# Patient Record
Sex: Female | Born: 1985 | Race: White | Hispanic: Yes | Marital: Single | Smoking: Never smoker
Health system: Southern US, Community
[De-identification: ages and names within clinical notes are randomized; demographics above are authoritative.]

## PROBLEM LIST (undated history)

## (undated) ENCOUNTER — Inpatient Hospital Stay (HOSPITAL_COMMUNITY): Payer: Self-pay

## (undated) DIAGNOSIS — Z789 Other specified health status: Secondary | ICD-10-CM

## (undated) HISTORY — PX: NO PAST SURGERIES: SHX2092

---
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
## 2004-03-26 ENCOUNTER — Inpatient Hospital Stay (HOSPITAL_COMMUNITY): Admission: AD | Admit: 2004-03-26 | Discharge: 2004-03-26 | Payer: Self-pay | Admitting: Family Medicine

## 2004-04-19 ENCOUNTER — Ambulatory Visit: Payer: Self-pay | Admitting: Family Medicine

## 2004-04-19 ENCOUNTER — Inpatient Hospital Stay (HOSPITAL_COMMUNITY): Admission: AD | Admit: 2004-04-19 | Discharge: 2004-04-21 | Payer: Self-pay | Admitting: *Deleted

## 2004-08-22 ENCOUNTER — Inpatient Hospital Stay (HOSPITAL_COMMUNITY): Admission: AD | Admit: 2004-08-22 | Discharge: 2004-08-22 | Payer: Self-pay | Admitting: *Deleted

## 2004-10-01 ENCOUNTER — Emergency Department (HOSPITAL_COMMUNITY): Admission: EM | Admit: 2004-10-01 | Discharge: 2004-10-01 | Payer: Self-pay | Admitting: Emergency Medicine

## 2004-11-27 ENCOUNTER — Inpatient Hospital Stay (HOSPITAL_COMMUNITY): Admission: AD | Admit: 2004-11-27 | Discharge: 2004-11-27 | Payer: Self-pay | Admitting: Obstetrics & Gynecology

## 2004-12-17 ENCOUNTER — Inpatient Hospital Stay (HOSPITAL_COMMUNITY): Admission: AD | Admit: 2004-12-17 | Discharge: 2004-12-17 | Payer: Self-pay | Admitting: Obstetrics & Gynecology

## 2005-07-24 ENCOUNTER — Inpatient Hospital Stay (HOSPITAL_COMMUNITY): Admission: AD | Admit: 2005-07-24 | Discharge: 2005-07-24 | Payer: Self-pay | Admitting: Obstetrics & Gynecology

## 2006-03-04 ENCOUNTER — Inpatient Hospital Stay (HOSPITAL_COMMUNITY): Admission: AD | Admit: 2006-03-04 | Discharge: 2006-03-06 | Payer: Self-pay | Admitting: Obstetrics

## 2008-01-06 ENCOUNTER — Inpatient Hospital Stay (HOSPITAL_COMMUNITY): Admission: AD | Admit: 2008-01-06 | Discharge: 2008-01-06 | Payer: Self-pay | Admitting: Family Medicine

## 2008-05-26 ENCOUNTER — Ambulatory Visit: Payer: Self-pay | Admitting: Family Medicine

## 2008-05-26 ENCOUNTER — Inpatient Hospital Stay (HOSPITAL_COMMUNITY): Admission: AD | Admit: 2008-05-26 | Discharge: 2008-05-26 | Payer: Self-pay | Admitting: Obstetrics & Gynecology

## 2008-05-28 ENCOUNTER — Ambulatory Visit (HOSPITAL_COMMUNITY): Admission: RE | Admit: 2008-05-28 | Discharge: 2008-05-28 | Payer: Self-pay | Admitting: Obstetrics & Gynecology

## 2008-08-29 ENCOUNTER — Inpatient Hospital Stay (HOSPITAL_COMMUNITY): Admission: AD | Admit: 2008-08-29 | Discharge: 2008-08-31 | Payer: Self-pay | Admitting: Obstetrics & Gynecology

## 2008-08-29 ENCOUNTER — Ambulatory Visit: Payer: Self-pay | Admitting: Advanced Practice Midwife

## 2010-06-13 LAB — RAPID URINE DRUG SCREEN, HOSP PERFORMED
Barbiturates: NOT DETECTED
Benzodiazepines: NOT DETECTED

## 2010-06-13 LAB — STREP B DNA PROBE

## 2010-06-13 LAB — DIFFERENTIAL
Basophils Relative: 0 % (ref 0–1)
Eosinophils Absolute: 0 10*3/uL (ref 0.0–0.7)
Lymphocytes Relative: 23 % (ref 12–46)
Lymphs Abs: 1.4 10*3/uL (ref 0.7–4.0)
Monocytes Absolute: 0.4 10*3/uL (ref 0.1–1.0)
Monocytes Relative: 6 % (ref 3–12)
Neutro Abs: 4.4 10*3/uL (ref 1.7–7.7)
Neutrophils Relative %: 71 % (ref 43–77)

## 2010-06-13 LAB — CBC
MCV: 95.3 fL (ref 78.0–100.0)
Platelets: 117 10*3/uL — ABNORMAL LOW (ref 150–400)
RBC: 3.97 MIL/uL (ref 3.87–5.11)
RDW: 13.1 % (ref 11.5–15.5)
WBC: 6.2 10*3/uL (ref 4.0–10.5)

## 2010-06-13 LAB — URINALYSIS, ROUTINE W REFLEX MICROSCOPIC
Bilirubin Urine: NEGATIVE
Nitrite: NEGATIVE
Protein, ur: NEGATIVE mg/dL

## 2010-06-13 LAB — HIV ANTIBODY (ROUTINE TESTING W REFLEX): HIV: NONREACTIVE

## 2010-06-13 LAB — GC/CHLAMYDIA PROBE AMP, GENITAL
Chlamydia, DNA Probe: NEGATIVE
GC Probe Amp, Genital: NEGATIVE

## 2010-06-13 LAB — TYPE AND SCREEN: ABO/RH(D): B POS

## 2010-06-13 LAB — RUBELLA SCREEN: Rubella: 10.7 IU/mL — ABNORMAL HIGH

## 2010-06-13 LAB — RPR: RPR Ser Ql: NONREACTIVE

## 2010-07-08 ENCOUNTER — Emergency Department (HOSPITAL_COMMUNITY): Payer: Self-pay

## 2010-07-08 ENCOUNTER — Emergency Department (HOSPITAL_COMMUNITY)
Admission: EM | Admit: 2010-07-08 | Discharge: 2010-07-08 | Disposition: A | Discharge status: Home / Self Care | Payer: Self-pay | Attending: Emergency Medicine | Admitting: Emergency Medicine

## 2010-07-08 DIAGNOSIS — R1110 Vomiting, unspecified: Secondary | ICD-10-CM | POA: Insufficient documentation

## 2010-07-08 DIAGNOSIS — R05 Cough: Secondary | ICD-10-CM | POA: Insufficient documentation

## 2010-07-08 DIAGNOSIS — R509 Fever, unspecified: Secondary | ICD-10-CM | POA: Insufficient documentation

## 2010-07-08 DIAGNOSIS — R059 Cough, unspecified: Secondary | ICD-10-CM | POA: Insufficient documentation

## 2010-07-08 DIAGNOSIS — J189 Pneumonia, unspecified organism: Secondary | ICD-10-CM | POA: Insufficient documentation

## 2010-07-08 DIAGNOSIS — IMO0001 Reserved for inherently not codable concepts without codable children: Secondary | ICD-10-CM | POA: Insufficient documentation

## 2010-07-08 LAB — URINALYSIS, ROUTINE W REFLEX MICROSCOPIC
Bilirubin Urine: NEGATIVE
Ketones, ur: NEGATIVE mg/dL
Nitrite: NEGATIVE
Protein, ur: NEGATIVE mg/dL
Specific Gravity, Urine: 1.019 (ref 1.005–1.030)
Urobilinogen, UA: 1 mg/dL (ref 0.0–1.0)

## 2010-07-08 LAB — DIFFERENTIAL
Basophils Absolute: 0 10*3/uL (ref 0.0–0.1)
Lymphocytes Relative: 25 % (ref 12–46)
Lymphs Abs: 1.2 10*3/uL (ref 0.7–4.0)

## 2010-07-08 LAB — COMPREHENSIVE METABOLIC PANEL
ALT: 24 U/L (ref 0–35)
AST: 16 U/L (ref 0–37)
Albumin: 3.2 g/dL — ABNORMAL LOW (ref 3.5–5.2)
Alkaline Phosphatase: 58 U/L (ref 39–117)
CO2: 27 mEq/L (ref 19–32)
Creatinine, Ser: <0.47 mg/dL (ref 0.4–1.2)
Glucose, Bld: 104 mg/dL — ABNORMAL HIGH (ref 70–99)
Potassium: 3.4 mEq/L — ABNORMAL LOW (ref 3.5–5.1)
Total Protein: 6.5 g/dL (ref 6.0–8.3)

## 2010-07-08 LAB — CBC
Hemoglobin: 13.5 g/dL (ref 12.0–15.0)
MCH: 30.3 pg (ref 26.0–34.0)
RBC: 4.46 MIL/uL (ref 3.87–5.11)
RDW: 11.7 % (ref 11.5–15.5)
WBC: 4.7 10*3/uL (ref 4.0–10.5)

## 2010-07-08 LAB — POCT PREGNANCY, URINE: Preg Test, Ur: NEGATIVE

## 2010-12-06 LAB — GC/CHLAMYDIA PROBE AMP, GENITAL
Chlamydia, DNA Probe: NEGATIVE
GC Probe Amp, Genital: NEGATIVE

## 2010-12-06 LAB — CBC
Hemoglobin: 13.6
RBC: 4.34
RDW: 12.3

## 2010-12-06 LAB — POCT PREGNANCY, URINE: Preg Test, Ur: POSITIVE

## 2010-12-06 LAB — WET PREP, GENITAL
Clue Cells Wet Prep HPF POC: NONE SEEN
Trich, Wet Prep: NONE SEEN

## 2010-12-06 LAB — HCG, QUANTITATIVE, PREGNANCY: hCG, Beta Chain, Quant, S: 10284 — ABNORMAL HIGH

## 2011-03-07 NOTE — L&D Delivery Note (Signed)
Delivery Note At 7:21 AM a viable malewas delivered via Vaginal, Spontaneous Delivery (Presentation: Right Occiput Anterior).  APGAR: 9, 9; weight .   Placenta status: Intact, Spontaneous.  Cord: 3 vessels with the following complications: nuchal cord X1, delivered through  Anesthesia: None  Episiotomy: None Lacerations: None Suture Repair: n/a Est. Blood Loss (mL): 400 Mom to postpartum.  Baby to nursery-stable.  CRESENZO-DISHMAN,Lori Rojas 10/07/2011, 7:34 AM

## 2011-05-16 ENCOUNTER — Inpatient Hospital Stay (HOSPITAL_COMMUNITY)
Admission: AD | Admit: 2011-05-16 | Discharge: 2011-05-16 | Disposition: A | Discharge status: Home / Self Care | Payer: Self-pay | Source: Ambulatory Visit | Attending: Family Medicine | Admitting: Family Medicine

## 2011-05-16 ENCOUNTER — Encounter (HOSPITAL_COMMUNITY): Payer: Self-pay | Admitting: *Deleted

## 2011-05-16 DIAGNOSIS — O99891 Other specified diseases and conditions complicating pregnancy: Secondary | ICD-10-CM | POA: Insufficient documentation

## 2011-05-16 DIAGNOSIS — R1011 Right upper quadrant pain: Secondary | ICD-10-CM | POA: Insufficient documentation

## 2011-05-16 DIAGNOSIS — Z3490 Encounter for supervision of normal pregnancy, unspecified, unspecified trimester: Secondary | ICD-10-CM

## 2011-05-16 DIAGNOSIS — R12 Heartburn: Secondary | ICD-10-CM | POA: Insufficient documentation

## 2011-05-16 HISTORY — DX: Other specified health status: Z78.9

## 2011-05-16 LAB — COMPREHENSIVE METABOLIC PANEL
AST: 17 U/L (ref 0–37)
BUN: 9 mg/dL (ref 6–23)
CO2: 26 mEq/L (ref 19–32)
Calcium: 8.9 mg/dL (ref 8.4–10.5)
Chloride: 102 mEq/L (ref 96–112)
Creatinine, Ser: 0.47 mg/dL — ABNORMAL LOW (ref 0.50–1.10)
GFR calc Af Amer: >90 mL/min (ref >90)
GFR calc non Af Amer: >90 mL/min (ref >90)
Glucose, Bld: 91 mg/dL (ref 70–99)
Total Bilirubin: 0.1 mg/dL — ABNORMAL LOW (ref 0.3–1.2)

## 2011-05-16 LAB — CBC
HCT: 32.8 % — ABNORMAL LOW (ref 36.0–46.0)
MCH: 30.9 pg (ref 26.0–34.0)
MCV: 90.6 fL (ref 78.0–100.0)
Platelets: 216 10*3/uL (ref 150–400)
RBC: 3.62 MIL/uL — ABNORMAL LOW (ref 3.87–5.11)

## 2011-05-16 LAB — URINALYSIS, ROUTINE W REFLEX MICROSCOPIC
Bilirubin Urine: NEGATIVE
Glucose, UA: NEGATIVE mg/dL
Hgb urine dipstick: NEGATIVE
Specific Gravity, Urine: <1.005 — ABNORMAL LOW (ref 1.005–1.030)
Urobilinogen, UA: 0.2 mg/dL (ref 0.0–1.0)

## 2011-05-16 MED ORDER — RANITIDINE HCL 150 MG PO TABS: 150.0000 mg | ORAL_TABLET | Freq: Two times a day (BID) | ORAL | Status: DC

## 2011-05-16 MED ORDER — GI COCKTAIL ~~LOC~~
30.0000 mL | Freq: Once | ORAL | Status: AC
  Administered 2011-05-16: 30 mL via ORAL
  Filled 2011-05-16: qty 30

## 2011-05-16 NOTE — MAU Note (Signed)
Pt reports pain in R upper side of abdomen that radiates into her back. The pain started last night and is still there currently. Pt rates pain 6/10. Pt denies bleeding or leaking of fluid.

## 2011-05-16 NOTE — MAU Provider Note (Signed)
History     CSN: [id number]  Arrival date and time: 05/16/11 2008   First Provider Initiated Contact with Patient 05/16/11 2107      Chief Complaint  Patient presents with  . Abdominal Pain   HPI 26 y.o. [id number] at Unknown EGA. RUQ pain since last night, constant, no n/v, no fever or chills. No prenatal care yet. + fetal movement x 2 months. No vaginal bleeding, no cramping/contractions. 3 prior NSVDs, uncomplicated prenatal history.     Past Medical History  Diagnosis Date  . No pertinent past medical history     Past Surgical History  Procedure Date  . No past surgeries     Family History  Problem Relation Age of Onset  . Anesthesia problems Neg Hx   . Hypotension Neg Hx   . Malignant hyperthermia Neg Hx   . Pseudochol deficiency Neg Hx     History  Substance Use Topics  . Smoking status: Not on file  . Smokeless tobacco: Never Used  . Alcohol Use: No    Allergies: No Known Allergies  Prescriptions prior to admission  Medication Sig Dispense Refill  . acetaminophen (TYLENOL) 325 MG suppository Place 325 mg rectally every 4 (four) hours as needed. Head ache        Review of Systems  Constitutional: Negative.   Respiratory: Negative.   Cardiovascular: Negative.   Gastrointestinal: Positive for abdominal pain. Negative for nausea, vomiting, diarrhea (RUQ) and constipation.  Genitourinary: Negative for dysuria, urgency, frequency, hematuria and flank pain.       Negative for vaginal bleeding, cramping/contractions  Musculoskeletal: Negative.   Neurological: Negative.   Psychiatric/Behavioral: Negative.    Physical Exam   There were no vitals taken for this visit.  Physical Exam  Nursing note and vitals reviewed. Constitutional: She is oriented to person, place, and time. She appears well-developed and well-nourished. No distress.  Cardiovascular: Normal rate.   Respiratory: Effort normal. No respiratory distress.  GI: Soft. She exhibits no  distension and no mass. There is tenderness (RUQ/epigastric). There is no rebound and no guarding.       Fundal height at umbilicus  Musculoskeletal: Normal range of motion.  Neurological: She is alert and oriented to person, place, and time.  Skin: Skin is warm and dry.  Psychiatric: She has a normal mood and affect.    MAU Course  Procedures  Results for orders placed during the hospital encounter of 05/16/11 (from the past 24 hour(s))  URINALYSIS, ROUTINE W REFLEX MICROSCOPIC     Status: Abnormal   Collection Time   05/16/11  8:20 PM      Component Value Range   Color, Urine YELLOW  YELLOW    APPearance CLEAR  CLEAR    Specific Gravity, Urine <1.005 (*) 1.005 - 1.030    pH 6.0  5.0 - 8.0    Glucose, UA NEGATIVE  NEGATIVE (mg/dL)   Hgb urine dipstick NEGATIVE  NEGATIVE    Bilirubin Urine NEGATIVE  NEGATIVE    Ketones, ur NEGATIVE  NEGATIVE (mg/dL)   Protein, ur NEGATIVE  NEGATIVE (mg/dL)   Urobilinogen, UA 0.2  0.0 - 1.0 (mg/dL)   Nitrite NEGATIVE  NEGATIVE    Leukocytes, UA NEGATIVE  NEGATIVE   CBC     Status: Abnormal   Collection Time   05/16/11  9:20 PM      Component Value Range   WBC 8.5  4.0 - 10.5 (K/uL)   RBC 3.62 (*) 3.87 - 5.11 (MIL/uL)  Hemoglobin 11.2 (*) 12.0 - 15.0 (g/dL)   HCT 16.1 (*) 09.6 - 46.0 (%)   MCV 90.6  78.0 - 100.0 (fL)   MCH 30.9  26.0 - 34.0 (pg)   MCHC 34.1  30.0 - 36.0 (g/dL)   RDW 04.5  40.9 - 81.1 (%)   Platelets 216  150 - 400 (K/uL)  COMPREHENSIVE METABOLIC PANEL     Status: Abnormal   Collection Time   05/16/11  9:20 PM      Component Value Range   Sodium 136  135 - 145 (mEq/L)   Potassium 3.2 (*) 3.5 - 5.1 (mEq/L)   Chloride 102  96 - 112 (mEq/L)   CO2 26  19 - 32 (mEq/L)   Glucose, Bld 91  70 - 99 (mg/dL)   BUN 9  6 - 23 (mg/dL)   Creatinine, Ser 9.14 (*) 0.50 - 1.10 (mg/dL)   Calcium 8.9  8.4 - 78.2 (mg/dL)   Total Protein 5.8 (*) 6.0 - 8.3 (g/dL)   Albumin 2.7 (*) 3.5 - 5.2 (g/dL)   AST 17  0 - 37 (U/L)   ALT 17  0 - 35  (U/L)   Alkaline Phosphatase 56  39 - 117 (U/L)   Total Bilirubin 0.1 (*) 0.3 - 1.2 (mg/dL)   GFR calc non Af Amer >90  >90 (mL/min)   GFR calc Af Amer >90  >90 (mL/min)   Pain completely resolved with GI cocktail  Assessment and Plan  26 y.o. G4P3003 at approx [redacted] weeks EGA GERD in pregnancy - resolved with GI cocktail, rx ranitidine Outpatient dating/anatomy u/s ordered Follow up for prenatal care as soon as possible - planning on care at [facility] 05/16/2011, 10:12 PM

## 2011-05-17 NOTE — MAU Provider Note (Signed)
Chart reviewed and agree with management and plan.  

## 2011-05-23 ENCOUNTER — Ambulatory Visit (HOSPITAL_COMMUNITY)
Admission: RE | Admit: 2011-05-23 | Discharge: 2011-05-23 | Disposition: A | Discharge status: Home / Self Care | Payer: Self-pay | Source: Ambulatory Visit | Attending: Advanced Practice Midwife | Admitting: Advanced Practice Midwife

## 2011-05-23 DIAGNOSIS — Z3689 Encounter for other specified antenatal screening: Secondary | ICD-10-CM | POA: Insufficient documentation

## 2011-05-23 DIAGNOSIS — Z3490 Encounter for supervision of normal pregnancy, unspecified, unspecified trimester: Secondary | ICD-10-CM

## 2011-08-27 ENCOUNTER — Inpatient Hospital Stay (HOSPITAL_COMMUNITY)
Admission: AD | Admit: 2011-08-27 | Discharge: 2011-08-27 | Disposition: A | Discharge status: Home / Self Care | Payer: Self-pay | Source: Ambulatory Visit | Attending: Family Medicine | Admitting: Family Medicine

## 2011-08-27 ENCOUNTER — Encounter (HOSPITAL_COMMUNITY): Payer: Self-pay | Admitting: *Deleted

## 2011-08-27 DIAGNOSIS — O4700 False labor before 37 completed weeks of gestation, unspecified trimester: Secondary | ICD-10-CM | POA: Diagnosis present

## 2011-08-27 DIAGNOSIS — O0930 Supervision of pregnancy with insufficient antenatal care, unspecified trimester: Secondary | ICD-10-CM

## 2011-08-27 LAB — WET PREP, GENITAL: Yeast Wet Prep HPF POC: NONE SEEN

## 2011-08-27 LAB — FETAL FIBRONECTIN: Fetal Fibronectin: NEGATIVE

## 2011-08-27 LAB — URINALYSIS, ROUTINE W REFLEX MICROSCOPIC
Leukocytes, UA: NEGATIVE
Nitrite: NEGATIVE
Protein, ur: NEGATIVE mg/dL
Specific Gravity, Urine: 1.02 (ref 1.005–1.030)
Urobilinogen, UA: 0.2 mg/dL (ref 0.0–1.0)

## 2011-08-27 LAB — DIFFERENTIAL
Basophils Absolute: 0 10*3/uL (ref 0.0–0.1)
Eosinophils Relative: 1 % (ref 0–5)
Lymphocytes Relative: 25 % (ref 12–46)
Lymphs Abs: 2 10*3/uL (ref 0.7–4.0)
Monocytes Absolute: 0.6 10*3/uL (ref 0.1–1.0)
Neutro Abs: 5.3 10*3/uL (ref 1.7–7.7)

## 2011-08-27 LAB — CBC
HCT: 37.1 % (ref 36.0–46.0)
MCV: 90.5 fL (ref 78.0–100.0)
RBC: 4.1 MIL/uL (ref 3.87–5.11)
RDW: 12.7 % (ref 11.5–15.5)
WBC: 7.9 10*3/uL (ref 4.0–10.5)

## 2011-08-27 LAB — RPR: RPR Ser Ql: NONREACTIVE

## 2011-08-27 LAB — HIV ANTIBODY (ROUTINE TESTING W REFLEX): HIV: NONREACTIVE

## 2011-08-27 NOTE — Progress Notes (Signed)
CNM collected GC/Chlam and GBS cultures.

## 2011-08-27 NOTE — MAU Note (Signed)
Contracting since 2000. No bleeding or leaking

## 2011-08-27 NOTE — Progress Notes (Signed)
EFm d/ced. D/C plan discussed with pt. Prenatal labs drawn. In house interpreter helping with specific questions patient had.

## 2011-08-27 NOTE — MAU Provider Note (Signed)
Chart reviewed and agree with management and plan.  

## 2011-08-27 NOTE — Progress Notes (Signed)
Dr Berline Chough notified of pt's admission and status. Aware u/a sent. Will see pt

## 2011-08-27 NOTE — Progress Notes (Signed)
Notified of negative FFN. CNM will let Dr Berline Chough know as well

## 2011-08-27 NOTE — Progress Notes (Signed)
Written and verbal d/c instructions given and understanding voiced. 

## 2011-08-27 NOTE — Progress Notes (Signed)
FFN and wet prep obtained.

## 2011-08-27 NOTE — MAU Provider Note (Signed)
Chief Complaint:  Contractions   First Provider Initiated Contact with Patient 08/27/11 0321      HPI  Lori Rojas is a 26 y.o. G4P3003 at [redacted]w[redacted]d presenting with contractions since yesterday afternoon.  She has not had prenatal care in this pregnancy because of transportation issues.  Her pregnancy is dated by [redacted]w[redacted]d sono done in MAU.  She reports good fetal movement, denies LOF, vaginal bleeding, vaginal itching/burning, urinary symptoms, h/a, dizziness, n/v, or fever/chills.     Pregnancy Course: uncomplicated  Past Medical History: Past Medical History  Diagnosis Date  . No pertinent past medical history     Past Surgical History: Past Surgical History  Procedure Date  . No past surgeries     Family History: Family History  Problem Relation Age of Onset  . Anesthesia problems Neg Hx   . Hypotension Neg Hx   . Malignant hyperthermia Neg Hx   . Pseudochol deficiency Neg Hx   . Other Neg Hx     Social History: History  Substance Use Topics  . Smoking status: Never Smoker   . Smokeless tobacco: Never Used  . Alcohol Use: No    Allergies: No Known Allergies  Meds:  Prescriptions prior to admission  Medication Sig Dispense Refill  . Prenatal Vit-Fe Fumarate-FA (PRENATAL MULTIVITAMIN) TABS Take 1 tablet by mouth daily.      Marland Kitchen DISCONTD: acetaminophen (TYLENOL) 325 MG suppository Place 325 mg rectally every 4 (four) hours as needed. Head ache      . DISCONTD: ranitidine (ZANTAC) 150 MG tablet Take 1 tablet (150 mg total) by mouth 2 (two) times daily.  60 tablet  1      Physical Exam  Blood pressure 99/63, pulse 87, temperature 97.9 F (36.6 C), temperature source Oral, resp. rate 20, height 5' (1.524 m), weight 73.029 kg (161 lb). GENERAL: Well-developed, well-nourished female in no acute distress.  HEENT: normocephalic, good dentition HEART: normal rate RESP: normal effort ABDOMEN: Soft, nontender, gravid appropriate for gestational age EXTREMITIES:  Nontender, no edema NEURO: alert and oriented  Pelvic exam: Cervix pink, visually closed, without lesion, scant white creamy discharge, vaginal walls and external genitalia normal  Dilation: 1 Effacement (%): 20 Cervical Position: Posterior Exam by:: Sharen Counter CNM  FHT:  145 with moderate variability, positive accels, negative decels--Category I Toco: Irritability with irregular contractions every 10-15 min   Labs: Results for orders placed during the hospital encounter of 08/27/11 (from the past 24 hour(s))  URINALYSIS, ROUTINE W REFLEX MICROSCOPIC     Status: Normal   Collection Time   08/27/11  2:30 AM      Component Value Range   Color, Urine YELLOW  YELLOW   APPearance CLEAR  CLEAR   Specific Gravity, Urine 1.020  1.005 - 1.030   pH 6.0  5.0 - 8.0   Glucose, UA NEGATIVE  NEGATIVE mg/dL   Hgb urine dipstick NEGATIVE  NEGATIVE   Bilirubin Urine NEGATIVE  NEGATIVE   Ketones, ur NEGATIVE  NEGATIVE mg/dL   Protein, ur NEGATIVE  NEGATIVE mg/dL   Urobilinogen, UA 0.2  0.0 - 1.0 mg/dL   Nitrite NEGATIVE  NEGATIVE   Leukocytes, UA NEGATIVE  NEGATIVE  FETAL FIBRONECTIN     Status: Normal   Collection Time   08/27/11  3:25 AM      Component Value Range   Fetal Fibronectin NEGATIVE  NEGATIVE  WET PREP, GENITAL     Status: Abnormal   Collection Time   08/27/11  3:25 AM  Component Value Range   Yeast Wet Prep HPF POC NONE SEEN  NONE SEEN   Trich, Wet Prep NONE SEEN  NONE SEEN   Clue Cells Wet Prep HPF POC NONE SEEN  NONE SEEN   WBC, Wet Prep HPF POC TOO NUMEROUS TO COUNT (*) NONE SEEN  CBC     Status: Normal   Collection Time   08/27/11  6:07 AM      Component Value Range   WBC 7.9  4.0 - 10.5 K/uL   RBC 4.10  3.87 - 5.11 MIL/uL   Hemoglobin 12.8  12.0 - 15.0 g/dL   HCT 16.1  09.6 - 04.5 %   MCV 90.5  78.0 - 100.0 fL   MCH 31.2  26.0 - 34.0 pg   MCHC 34.5  30.0 - 36.0 g/dL   RDW 40.9  81.1 - 91.4 %   Platelets 175  150 - 400 K/uL  DIFFERENTIAL     Status:  Normal   Collection Time   08/27/11  6:07 AM      Component Value Range   Neutrophils Relative 67  43 - 77 %   Neutro Abs 5.3  1.7 - 7.7 K/uL   Lymphocytes Relative 25  12 - 46 %   Lymphs Abs 2.0  0.7 - 4.0 K/uL   Monocytes Relative 7  3 - 12 %   Monocytes Absolute 0.6  0.1 - 1.0 K/uL   Eosinophils Relative 1  0 - 5 %   Eosinophils Absolute 0.1  0.0 - 0.7 K/uL   Basophils Relative 0  0 - 1 %   Basophils Absolute 0.0  0.0 - 0.1 K/uL     Assessment: Threatened preterm labor No prenatal care   Plan: GC/Chlamydia, GBS, prenatal panel pending D/C home with PTL precautions Message sent to [facility] to make appointment for pt Return to MAU as needed  LEFTWICH-KIRBY, Nichelle Renwick 6/23/20136:31 AM

## 2011-08-27 NOTE — Discharge Instructions (Signed)
El ABC del Psychiatrist (ABCs of Pregnancy) A A La atencin antes del parto es muy importante. Asegrese de Science writer a su mdico y recibir atencin prenatal tan rpido como usted crea que est embarazada. En este momento, se la controlar por posibles infecciones, anormalidades genticas y problemas potenciales. Este es el momento para discutir la dieta, el ejercicio, el [place], los medicamentos, el [place], los medicamentos para el dolor durante el parto y la posibilidad de un parto por Copy. Haga todas las preguntas que puedan preocuparla. Es importante que consulte a su mdico con regularidad durante todo Firefighter. Evite el contacto con sustancias txicas y productos qumicos, como disolventes de limpieza, plomo y mercurio, algunos insecticidas y pinturas. Las mujeres embarazadas deben evitar la exposicin a los vapores de [place], y los humos que la hagan sentir enferma, mareada o dbil. Cuando sea posible, tenga una consulta con su mdico antes del embarazo para recibir Optometrist pudiera sugerir, como tomar cido flico, Radio producer ejercicios, dejar de fumar, evitar las bebidas alcohlicas, etc. B La lactancia materna es la opcin ms saludable para usted y su beb. Tiene muchos beneficios nutricionales para al beb y [address] Tambin crea un vnculo muy estrecho entre el beb y [place]. Hable con su mdico, su familia, sus amigos y su empleador acerca de cmo usted decidir alimentar a su beb y como pueden ayudarle a decidir. No todos los defectos congnitos pueden ser evitados, pero una mujer puede tomar decisiones para aumentar las posibilidades de tener un beb sano. Muchos defectos congnitos ocurren al principio del Psychiatrist, a veces antes de que la mujer sepa que est [place]. Los defectos o anormalidades congnitas de cualquier nio de su familia o la del padre deben ser comentados con su mdico. Obtener un buen sostn para los cambios del tamao de las [place]. selo en especial  cuando hace ejercicios o cuando amamante.  C Festeje la noticia de su embarazo con su Cnyuge/padre y su familia. Las Clases de parto son tiles para usted y Product/process development scientist cnyuge/padre, porque le ayudan a entender lo que sucede durante el Psychiatrist y [place]. El parto por cesrea se debe discutir con el mdico para estar preparado para esa posibilidad. Las ventajas y las desventajas de la [place] si es un nio, se deben comentar con Presenter, broadcasting. El tabaquismo durante el embarazo puede dar como resultado bebs con bajo peso al nacer. Se ha asociado con la infertilidad, abortos espontneos, embarazos ectpicos, mortalidad infantil y Insurance underwriter salud (morbilidad) en la infancia. Adems, el tabaquismo puede causar problemas de aprendizaje a largo plazo. Si usted fuma, debe tratar de dejar de fumar antes de [place] y no durante el embarazo. El humo secundario tambin puede hacerles dao a la mam y a su beb en desarrollo. Es Neomia Dear buena idea pedirle a la gente que deje de fumar a su alrededor Academic librarian y despus del nacimiento del beb. El [place] extra es necesario durante el Psychiatrist y se Occupational psychologist en las vitaminas prenatales, en los productos lcteos, vegetales de hojas verdes y en los suplementos de calcio. D Una Dieta saludable de acuerdo a su peso y su talla actual, junto con las vitaminas y suplementos minerales debe ser discutida con su mdico. El abuso /o la violencia Domstica deben darse a conocer inmediatamente para corregir la situacin. Tome ms agua cuando haga ejercicios para mantenerse hidratada. Por lo general las molestias de la espalda y las piernas aparecen y avanzan a Glass blower/designer de mediados del [place]  trimestre hasta el nacimiento del beb. Eso se debe al aumento de tamao del beb y del tero, que tambin puede afectar su equilibro. No consuma Drogas. Las drogas ilegales pueden daar seriamente al beb y a usted. Beba ms lquidos (lo mejor es el agua) Durante todo el embarazo para  ayudar a su cuerpo a Radio producer aumento del volumen sanguneo. Beba por lo menos 6 a 8 vasos de [place], jugo de frutas o [employer]. Una buena manera de saber que est bebiendo suficiente lquido es cuando la [place] se ve casi como el agua clara o de color amarillo muy claro.  E Coma alimentos sanos para obtener los nutrientes que usted y su beb necesitan al Psychologist, clinical. Sus alimentos deben Johnson [employer] Johnson cinco grupos bsicos. El Ejercicio (30 minutos de ejercicio leve a moderado al da) es importante y se aconseja durante el [place], si no tiene problemas de [place]. Si el ejercicio le causa malestar o mareos debe interrumpirlo e informar a su mdico. Las emociones durante el embarazo pueden pasar de la euforia a la depresin y deben ser comprendidos por usted, su pareja y su familia. F La evaluacin fetal con ecografas, la amniocentesis y los controles durante el [place] y [place] parto son algo habitual y a Research officer, political party. Tome 400 mg. de cido [employer], si es posible antes y Energy Transfer Partners primeros meses del Psychiatrist para reducir el riesgo de defectos congnitos del cerebro y la columna vertebral. Todas las mujeres que podran quedar embarazadas deben tomar una vitamina con cido flico todos [place]. Tambin es importante seguir una dieta saludable con alimentos enriquecidos (cereales enriquecidos, arroz, panes y pastas) y alimentos que sean fuentes naturales de cido flico (jugo de naranja, vegetales de hojas verdes, frijoles, man, brcoli, esprragos, arvejas y Therapist, occupational). El padre debe involucrarse en todos los aspectos del embarazo incluyendo el cuidado prenatal, las clases de [place], [place] parto y el tiempo despus del [place]. Los padres tambin pueden tener problemas emocionales como ser [place], [place] econmicos y llevar adelante [place]. G Las pruebas Genticas se deben Landscape architect. Es Secretary/administrator la historia de su familia y la del padre. Si ha habido problemas con embarazos o  defectos de nacimiento en su familia, informe de inmediato a su mdico. Adems, los consejeros genticos pueden hablar con usted acerca de la informacin que pueda necesitar en la toma de decisiones acerca de tener una familia. Usted puede llamar a un centro mdico en su rea para ayudar en la bsqueda de un consejero gentico certificado por el consejo. La asesora y prueba gentica se deben hacer antes del embarazo siempre que sea posible, especialmente si hay antecedentes de problemas en la familia del padre o de la [place]. Ciertos grupos tnicos tienen un mayor riesgo de defectos genticos. H Familiarcese con [facility] en el que va a tener su beb. Conozca cunto tiempo se tarda en llegar, la sala de preparto y nacimiento y los procedimientos del hospital. Asegrese de que su seguro mdico es aceptado en ese Environmental consultant. Haga que su Hogar est listo para el beb, incluyendo la ropa, la habitacin del beb (cuando sea posible), muebles y asientos del automvil. El lavado de manos es importante durante todo el da, especialmente despus de tocar carne cruda y aves de corral, de cambiar el paal del beb y de ir al bao. Esto puede ayudarle a Geologist, engineering de bacterias y virus que causan infecciones. Su pelo puede volverse seco y ms fino, pero  volver a la normalidad unas semanas despus de que nazca el beb. La acidez es un problema comn que puede ser tratado tomando anticidos recetados por su mdico, comiendo alimentos en porciones ms pequeas 5  6 veces por da, no beber lquidos al comer, beber entre comidas y elevar la cabecera de su cama 2  3 pulgadas. I El seguro que le d cobertura a usted y al beb, a los gastos del mdico y del hospital debe ser revisado, para saber con anticipacin qu gastos no cubiertos por su plan de seguro deber pagar usted. Si no tiene seguro mdico, por lo general hay clnicas y servicios disponibles para usted en su comunidad. Tome 30 mg. de hierro durante el  [employer] segn lo prescrito por su mdico para reducir el riesgo de niveles bajos de glbulos rojos (anemia) mas adelante en el embarazo. Todas las mujeres en edad frtil deben consumir una dieta rica en hierro. J La madre, el padre y los otros hijos deben hacer un esfuerzo conjunto para adaptarse al [employer] en el aspecto econmico, emocional y psicolgico durante Firefighter. nase a un grupo de apoyo para las futuras [place]. O bien, vaya a clases de crianza de hijos o del parto. La familia tiene que participar cuando sea posible. K Conozca sus lmites. Infrmele a su mdico si experimenta:   Cualquier tipo de dolor.   Clicos fuertes.   Aumenta de peso en un corto perodo de tiempo (5 libras en 3 a 5 das).   Hemorragia vaginal, prdida de lquido amnitico.   Dolor de [place], problemas de visin.   Mareos, [employer]ell [employer], le falta el aire.   Dolor en el pecho.   Fiebre de 102 F (38.9 C) o ms.   Elimina lquido claro por la vagina.   Dolor al Beatrix Shipper.   Violencia familiar.   Latidos irregulares del corazn (palpitaciones).   Latidos rpidos del corazn (taquicardia).   Siente ganas de vomitar (nuseas) y vmitos.   Dificultad para caminar, retencin de lquidos (edema).   Debilidad muscular.   Si su beb tiene disminucin de [place].   Diarrea persistente.   Flujo vaginal anormal.   Contracciones uterinas en intervalos de 20 minutos.   Dolor de espalda que baja por la pierna.  L Aprenda y practique que, Lo que usted come y bebe debe ser con moderacin y sano para usted y su beb. Las drogas PepsiCo el alcohol y la cafena son temas importantes para las mujeres [place]. No hay una cantidad segura de alcohol que una mujer pueda beber durante el [place]. El sndrome de alcoholismo fetal, un trastorno que se caracteriza por retraso del crecimiento, anormalidades faciales y disfuncin del sistema nervioso central, es causado por el uso de alcohol de la mujer  durante el [place]. La cafena, que se encuentra en el t, caf, refrescos y chocolate, tambin deben ser limitados. Asegrese de leer las etiquetas cuando se trata de reducir el consumo de cafena durante el [place]. Ms de 200 alimentos, bebidas y medicamentos sin receta contienen cafena y tienen un ato contenido de sal! Hay cafs y tes que no contienen cafena.  M Las enfermedades mdicas tales como diabetes, epilepsia e hipertensin arterial deben ser tratados y mantenidos bajo control antes del embarazo siempre que sea posible, pero especialmente durante el [place]. Consulte con su mdico acerca de cualquier medicamento que pueda ser necesario cambiar o ajustar la dosis. Si est tomando algn medicamento, consulte con su mdico sobre su seguridad para Engineer, site  o antes de quedar embarazada, cuando sea posible. Tambin, asegrese de informar todas las hierbas o vitaminas que est tomando. Tambin se trata de medicamentos! Hable con su mdico sobre The Mutual of Omaha, con receta y de venta libre que est tomando. Durante su visita prenatal, discuta los Chesapeake Energy su mdico le puede dar durante el parto y el nacimiento. N Nunca tenga miedo de preguntar a su mdico acerca de su salud, el progreso del [place], problemas familiares, situaciones de estrs y pedirle la recomendacin de un pediatra, si no tiene uno. Es mejor tomar todas las precauciones y Administrator, Civil Service pregunta o preocupacin que usted tenga durante las visitas a su consultorio. Es Neomia Dear buena idea escribir sus preguntas antes de visitar al American Express. O Los medicamentos de [place] para tos y resfriados pueden contener alcohol u otros ingredientes que se deben Theatre manager. Consulte con su mdico sobre los Express Scripts, hierbas o medicamentos de venta libre que est tomando o puede tomar durante el Psychiatrist.  P La actividad fsica durante el embarazo puede beneficiarla tanto a usted  y a su beb al disminuir la incomodidad y la fatiga produciendo una sensacin de [place], y el aumento de la probabilidad de una pronta recuperacin despus del parto. El ejercicio ligero a moderado Academic librarian fortalece el vientre (abdomen) y msculos de la espalda. Esto le ayuda a Banker. Practicar yoga, caminar, nadar y montar en una bicicleta fija son por lo general ejercicios seguros para las mujeres embarazadas. Evite el buceo, ejercicios de gran altura (ms de 3000 pies), esqu, paseos a caballo, deportes de contacto, etc. Consulte siempre con su mdico antes de comenzar cualquier tipo de ejercicio, Especialmente durante el embarazo y, especialmente si no hacan ejercicios antes de quedar embarazada. Q Las nuseas y Dentist estomacal son comunes durante el Psychiatrist. Coma un par de galletitas o tostadas secas antes de levantarse de la cama. Los alimentos que normalmente le gustan pueden hacerla sentir mal del [place]. Puede que tenga que sustituir otros alimentos nutritivos. [place] cinco o seis porciones pequeas al Geophysical data processor de tres grandes pueden hacer que usted se sienta mejor. No beba con sus alimentos, beba Charter Communications. Las preguntas Que usted tiene deben estar escritas y consultadas durante sus visitas prenatales. R Lea y haga planes para que su casa sea segura para el beb. Hay consejos importantes para que su hogar sea un ambiente ms seguro para su beb. Revise los consejos y haga su hogar ms seguro para usted y su beb. Lea las etiquetas de los alimentos con respecto a las caloras, sal y Owens[employer]Illinois. S Las salas de saunas, baeras de [place] caliente y vapor deben evitarse durante Firefighter. El calor excesivo puede ser perjudicial durante el [place]. Su mdico la examinar para Hotel manager de transmisin sexual y trastornos genticos durante las visitas prenatales. Aprenda los Signos del [place] de [place]. Las relaciones Sexuales durante  el Psychiatrist son seguras a menos que haya un problema mdico o del Psychiatrist y su mdico le recomiende evitarlas. T Se debe evitar viajar largas distancias, especialmente en el tercer trimestre de su embarazo. Si tiene que viajar afuera del [place], asegrese de llevar una copia de sus registros mdicos y un plan de seguro mdico. Usted no debe recorrer largas distancias sin consultar con su mdico primero. La mayora de las campaas areas no permiten viajar despus de 36 semanas de embarazo. La Toxoplasmosis es una infeccin causada por un parsito  que puede daar seriamente al beb nonato. Evite comer carne poco cocida y tocar la arena higinica para gatos. Asegrese de usar guantes para la Automotive engineer. El hormigueo de las manos y los dedos no es inusual y se debe a la retencin de lquidos. Esto desaparece despus del nacimiento del beb. U Aumenta el tamao del tero durante Designer, fashion/clothing. Sus riones comenzarn a funcionar ms eficientemente. Esto puede hacer que Usted sienta la necesidad de orinar ms a menudo. Tambin puede tener fugas de orina al estornudar, toser o rer. Esto se debe a que el crecimiento del tero presiona la vejiga, que se encuentra directamente adelante y ligeramente por debajo del tero durante los primeros meses del [place]. Si experimenta ardor con frecuencia al Beatrix Shipper o en la orina presenta sangre, asegrese de decirle a su mdico. El tamao de su tero en el tercer trimestre puede causar un problema con el equilibro. Es recomendable mantener una buena postura y Automotive engineer usar tacones altos durante ese tiempo. Un Ultrasonido de su beb puede ser necesario durante el embarazo y es seguro para usted y su beb. V Las vacunas son Neomia Dear preocupacin importante para las mujeres embarazadas. Pngase las vacunas necesarias antes del embarazo. El centro para el control de enfermedades ([email or web address]) tiene directrices claras para el uso de vacunas durante el [place]. Revise la lista,  augrese de Science writer con su mdico. Las Vitaminas prenatales son tiles y saludables para usted y el beb. No tome otras vitaminas, excepto lo que se recomiende. Tomar algunas vitaminas en exceso puede causar problemas de sobredosis. Los vmitos continuos deben ser reportados a su mdico. Las venas Varicosas pueden aparecer sobre todo si hay antecedentes familiares. Deben desaparecer despus del nacimiento del beb. Usar calzas ayuda si no le producen molestias. W Tener sobrepeso o bajo peso durante el embarazo puede causarle problemas. Trate de estar en un rango de 15 libras de su peso ideal antes del Psychiatrist. Recuerde, el embarazo no es el momento para estar a dieta! No deje de comer o saltee las comidas si aumenta de [place]. Tanto usted como su beb necesitan las caloras y la nutricin que recibe de una dieta saludable. Asegrese de Science writer con su mdico acerca de su dieta. Hay un plan de frmula y dieta disponibles en funcin de si tiene sobrepeso o bajo peso. Su mdico o nutricionista puede ayudarle y asesorarle si es necesario. X Evite los rayos X. Si usted tiene que hacerse un tratamiento dental o pruebas diagnsticas, informe a su dentista o mdico que est embarazada para eventualmente tomar cuidados extra. Los rayos X slo se deben tomar [employer] de no tomarlos son mayores que el riesgo de tomarlos. Si es necesario, slo una cantidad mnima de radiacin debe ser [place]. Cuando los rayos X son necesarios, se deben usar los escudos protectores de plomo para cubrir las reas del cuerpo que no necesiten visualizarse en la radiografa. Y Su beb la ama. Amamantar a su beb crea un vnculo amoroso y Exxon [employer]il Corporation. Dle a su beb un medio ambiente sano para vivir durante el embarazo. Los bebs y nios requieren un cuidado y [place] orientacin constante. Su salud y su seguridad deben ser vigiladas cuidadosamente en todo momento. Despus de que nazca el beb, descanse o duerma una  siesta cuando el beb est durmiendo. Z Consiga descansar. Asegrese de obtener suficiente descanso. Descanse de lado tan a menudo como sea posible, especialmente se aconseja el lado izquierdo. Proporciona la mejor circulacin para su beb  y [place] a reducir la hinchazn. Trate de tomar una siesta de treinta a cuarenta y cinco minutos en la tarde cuando sea posible. Despus de que nazca el beb, descanse o duerma una siesta cuando el beb est durmiendo. Trate de elevar los pies cuando le sea posible. Ayuda a la circulacin en las piernas y a Building services engineer. La mayora de informacin es de cortesa de CDC. Document Released: 12/18/2006 Document Revised: 02/09/2011 [facility] Patient Information 2012 [place], [place].

## 2011-08-29 LAB — CULTURE, BETA STREP (GROUP B ONLY)

## 2011-08-29 LAB — GC/CHLAMYDIA PROBE AMP, GENITAL
Chlamydia, DNA Probe: NEGATIVE
GC Probe Amp, Genital: NEGATIVE

## 2011-09-04 ENCOUNTER — Encounter: Payer: Self-pay | Admitting: Physician Assistant

## 2011-10-06 ENCOUNTER — Inpatient Hospital Stay (HOSPITAL_COMMUNITY)
Admission: AD | Admit: 2011-10-06 | Discharge: 2011-10-06 | Disposition: A | Discharge status: Home / Self Care | Payer: Self-pay | Source: Ambulatory Visit | Attending: Obstetrics and Gynecology | Admitting: Obstetrics and Gynecology

## 2011-10-06 ENCOUNTER — Encounter (HOSPITAL_COMMUNITY): Payer: Self-pay

## 2011-10-06 DIAGNOSIS — O0930 Supervision of pregnancy with insufficient antenatal care, unspecified trimester: Secondary | ICD-10-CM | POA: Insufficient documentation

## 2011-10-06 DIAGNOSIS — O479 False labor, unspecified: Secondary | ICD-10-CM | POA: Insufficient documentation

## 2011-10-06 LAB — DIFFERENTIAL
Basophils Absolute: 0 10*3/uL (ref 0.0–0.1)
Basophils Relative: 0 % (ref 0–1)
Eosinophils Absolute: 0 10*3/uL (ref 0.0–0.7)
Eosinophils Relative: 0 % (ref 0–5)
Lymphocytes Relative: 20 % (ref 12–46)

## 2011-10-06 LAB — CBC
MCH: 31.2 pg (ref 26.0–34.0)
MCV: 93 fL (ref 78.0–100.0)
Platelets: 164 10*3/uL (ref 150–400)
RDW: 12.8 % (ref 11.5–15.5)
WBC: 7.3 10*3/uL (ref 4.0–10.5)

## 2011-10-06 LAB — HEPATITIS B SURFACE ANTIGEN: Hepatitis B Surface Ag: NEGATIVE

## 2011-10-06 LAB — TYPE AND SCREEN: ABO/RH(D): B POS

## 2011-10-06 NOTE — MAU Note (Signed)
Hospital translator in triage. Patient states she is having contractions every 7 minutes. Denies any bleeding or leaking and reports good fetal movement.

## 2011-10-06 NOTE — MAU Note (Signed)
Patient has not had any prenatal care. Had an appointment in our [facility] but could not come.

## 2011-10-07 ENCOUNTER — Inpatient Hospital Stay (HOSPITAL_COMMUNITY)
Admission: AD | Admit: 2011-10-07 | Discharge: 2011-10-08 | DRG: 775 | Disposition: A | Discharge status: Home / Self Care | Payer: Medicaid Other | Source: Ambulatory Visit | Attending: Obstetrics and Gynecology | Admitting: Obstetrics and Gynecology

## 2011-10-07 ENCOUNTER — Encounter (HOSPITAL_COMMUNITY): Payer: Self-pay | Admitting: *Deleted

## 2011-10-07 LAB — CBC
Hemoglobin: 13.5 g/dL (ref 12.0–15.0)
MCHC: 34 g/dL (ref 30.0–36.0)
RDW: 12.7 % (ref 11.5–15.5)
WBC: 8.2 10*3/uL (ref 4.0–10.5)

## 2011-10-07 LAB — RPR: RPR Ser Ql: NONREACTIVE

## 2011-10-07 LAB — TYPE AND SCREEN: ABO/RH(D): B POS

## 2011-10-07 MED ORDER — PRENATAL MULTIVITAMIN CH
1.0000 | ORAL_TABLET | Freq: Every day | ORAL | Status: DC
  Administered 2011-10-07 – 2011-10-08 (×2): 1 via ORAL
  Filled 2011-10-07 (×2): qty 1

## 2011-10-07 MED ORDER — SENNOSIDES-DOCUSATE SODIUM 8.6-50 MG PO TABS
2.0000 | ORAL_TABLET | Freq: Every day | ORAL | Status: DC
  Administered 2011-10-07: 2 via ORAL

## 2011-10-07 MED ORDER — ONDANSETRON HCL 4 MG/2ML IJ SOLN: 4.0000 mg | INTRAMUSCULAR | Status: DC | PRN

## 2011-10-07 MED ORDER — FLEET ENEMA 7-19 GM/118ML RE ENEM: 1.0000 | ENEMA | RECTAL | Status: DC | PRN

## 2011-10-07 MED ORDER — METHYLERGONOVINE MALEATE 0.2 MG/ML IJ SOLN: 0.2000 mg | INTRAMUSCULAR | Status: DC | PRN

## 2011-10-07 MED ORDER — ACETAMINOPHEN 325 MG PO TABS: 650.0000 mg | ORAL_TABLET | ORAL | Status: DC | PRN

## 2011-10-07 MED ORDER — IBUPROFEN 600 MG PO TABS
600.0000 mg | ORAL_TABLET | Freq: Four times a day (QID) | ORAL | Status: DC | PRN
  Administered 2011-10-07: 600 mg via ORAL
  Filled 2011-10-07: qty 1

## 2011-10-07 MED ORDER — ONDANSETRON HCL 4 MG/2ML IJ SOLN: 4.0000 mg | Freq: Four times a day (QID) | INTRAMUSCULAR | Status: DC | PRN

## 2011-10-07 MED ORDER — BENZOCAINE-MENTHOL 20-0.5 % EX AERO: 1.0000 "application " | INHALATION_SPRAY | CUTANEOUS | Status: DC | PRN

## 2011-10-07 MED ORDER — METHYLERGONOVINE MALEATE 0.2 MG PO TABS: 0.2000 mg | ORAL_TABLET | ORAL | Status: DC | PRN

## 2011-10-07 MED ORDER — MAGNESIUM HYDROXIDE 400 MG/5ML PO SUSP: 30.0000 mL | ORAL | Status: DC | PRN

## 2011-10-07 MED ORDER — OXYTOCIN 40 UNITS IN LACTATED RINGERS INFUSION - SIMPLE MED
62.5000 mL/h | Freq: Once | INTRAVENOUS | Status: AC
  Administered 2011-10-07: 62.5 mL/h via INTRAVENOUS
  Filled 2011-10-07: qty 1000

## 2011-10-07 MED ORDER — LACTATED RINGERS IV SOLN: 500.0000 mL | INTRAVENOUS | Status: DC | PRN

## 2011-10-07 MED ORDER — SIMETHICONE 80 MG PO CHEW: 80.0000 mg | CHEWABLE_TABLET | ORAL | Status: DC | PRN

## 2011-10-07 MED ORDER — ZOLPIDEM TARTRATE 5 MG PO TABS: 5.0000 mg | ORAL_TABLET | Freq: Every evening | ORAL | Status: DC | PRN

## 2011-10-07 MED ORDER — CITRIC ACID-SODIUM CITRATE 334-500 MG/5ML PO SOLN: 30.0000 mL | ORAL | Status: DC | PRN

## 2011-10-07 MED ORDER — LIDOCAINE HCL (PF) 1 % IJ SOLN
30.0000 mL | INTRAMUSCULAR | Status: DC | PRN
  Filled 2011-10-07: qty 30

## 2011-10-07 MED ORDER — IBUPROFEN 600 MG PO TABS
600.0000 mg | ORAL_TABLET | Freq: Four times a day (QID) | ORAL | Status: DC
  Administered 2011-10-07 – 2011-10-08 (×5): 600 mg via ORAL
  Filled 2011-10-07 (×4): qty 1

## 2011-10-07 MED ORDER — LACTATED RINGERS IV SOLN: INTRAVENOUS | Status: DC

## 2011-10-07 MED ORDER — OXYCODONE-ACETAMINOPHEN 5-325 MG PO TABS: 1.0000 | ORAL_TABLET | ORAL | Status: DC | PRN

## 2011-10-07 MED ORDER — LANOLIN HYDROUS EX OINT: TOPICAL_OINTMENT | CUTANEOUS | Status: DC | PRN

## 2011-10-07 MED ORDER — ONDANSETRON HCL 4 MG PO TABS: 4.0000 mg | ORAL_TABLET | ORAL | Status: DC | PRN

## 2011-10-07 MED ORDER — MISOPROSTOL 200 MCG PO TABS
ORAL_TABLET | ORAL | Status: AC
  Administered 2011-10-07: 800 ug via RECTAL
  Filled 2011-10-07: qty 4

## 2011-10-07 MED ORDER — FERROUS SULFATE 325 (65 FE) MG PO TABS
325.0000 mg | ORAL_TABLET | Freq: Two times a day (BID) | ORAL | Status: DC
  Administered 2011-10-07 – 2011-10-08 (×2): 325 mg via ORAL
  Filled 2011-10-07 (×2): qty 1

## 2011-10-07 MED ORDER — DIBUCAINE 1 % RE OINT: 1.0000 "application " | TOPICAL_OINTMENT | RECTAL | Status: DC | PRN

## 2011-10-07 MED ORDER — TETANUS-DIPHTH-ACELL PERTUSSIS 5-2.5-18.5 LF-MCG/0.5 IM SUSP
0.5000 mL | Freq: Once | INTRAMUSCULAR | Status: AC
  Administered 2011-10-08: 0.5 mL via INTRAMUSCULAR
  Filled 2011-10-07: qty 0.5

## 2011-10-07 MED ORDER — WITCH HAZEL-GLYCERIN EX PADS: 1.0000 "application " | MEDICATED_PAD | CUTANEOUS | Status: DC | PRN

## 2011-10-07 MED ORDER — DIPHENHYDRAMINE HCL 25 MG PO CAPS: 25.0000 mg | ORAL_CAPSULE | Freq: Four times a day (QID) | ORAL | Status: DC | PRN

## 2011-10-07 MED ORDER — MEASLES, MUMPS & RUBELLA VAC ~~LOC~~ INJ
0.5000 mL | INJECTION | Freq: Once | SUBCUTANEOUS | Status: DC
  Filled 2011-10-07: qty 0.5

## 2011-10-07 MED ORDER — OXYTOCIN BOLUS FROM INFUSION
250.0000 mL | Freq: Once | INTRAVENOUS | Status: DC
  Filled 2011-10-07: qty 500

## 2011-10-07 NOTE — Progress Notes (Signed)
Pt called out stating baby coming

## 2011-10-07 NOTE — H&P (Signed)
  Lori Rojas is a 26 y.o. female [phone] with IUP at [redacted]w[redacted]d presenting for active labor. Pt states she has been having regular, every 3-5 minutes contractions, associated with none vaginal bleeding, membranes are intact, with active fetal movement.    Prenatal History/Complications: No PNC Past Medical History: Past Medical History  Diagnosis Date  . No pertinent past medical history     Past Surgical History: Past Surgical History  Procedure Date  . No past surgeries     Obstetrical History: OB History    Grav Para Term Preterm Abortions TAB SAB Ect Mult Living   4 3 3       3       Gynecological History: OB History    Grav Para Term Preterm Abortions TAB SAB Ect Mult Living   4 3 3       3       Social History: History   Social History  . Marital Status: Single    Spouse Name: N/A    Number of Children: N/A  . Years of Education: N/A   Social History Main Topics  . Smoking status: Never Smoker   . Smokeless tobacco: Never Used  . Alcohol Use: No  . Drug Use: No  . Sexually Active: Yes     pregnant   Other Topics Concern  . None   Social History Narrative  . None    Family History: Family History  Problem Relation Age of Onset  . Anesthesia problems Neg Hx   . Hypotension Neg Hx   . Malignant hyperthermia Neg Hx   . Pseudochol deficiency Neg Hx   . Other Neg Hx     Allergies: No Known Allergies  Prescriptions prior to admission  Medication Sig Dispense Refill  . Prenatal Vit-Fe Fumarate-FA (PRENATAL MULTIVITAMIN) TABS Take 1 tablet by mouth daily.        Review of Systems - Negative   Blood pressure 121/76, pulse 75, temperature 98.5 F (36.9 C), temperature source Oral, resp. rate 18, height 5' (1.524 m), weight 78.472 kg (173 lb). General appearance: alert, cooperative and mild distress Lungs: clear to auscultation bilaterally Heart: regular rate and rhythm Abdomen: soft, non-tender; bowel sounds normal  Extremities: Homans  sign is negative, no sign of DVT DTR's 2+ Presentation: cephalic Fetal monitoringBaseline: 140 bpm, moderate variability, + accels, no decels Uterine activity q 4-5 mnutes Dilation: 5.5 Effacement (%): 80 Station: -2 Exam by:: Ninetta Lights RN   Prenatal labs: ABO, Rh: --/--/B POS (08/03 0335) Antibody: NEG (08/03 0335) Rubella:  imune RPR: NON REACTIVE (08/02 1932)  HBsAg: NEGATIVE (08/02 1932)  HIV: NON REACTIVE (08/02 1932)  GBS:   negative 1 hr Glucola not done Genetic screening  Not done Anatomy US normal   Assessment: Lori Rojas is a 26 y.o. [phone] with an IUP at [redacted]w[redacted]d presenting for active labor  Plan: Expectant management   CRESENZO-DISHMAN,Oluwadara Gorman 10/07/2011, 5:47 AM

## 2011-10-08 MED ORDER — IBUPROFEN 600 MG PO TABS: 600.0000 mg | ORAL_TABLET | Freq: Four times a day (QID) | ORAL | Status: AC

## 2011-10-08 NOTE — Progress Notes (Signed)
Clinical Social Work Department PSYCHOSOCIAL ASSESSMENT - MATERNAL/CHILD 10/08/2011  Patient:  Lori Rojas, Lori Rojas  Account Number:  [id number]  Admit Date:  10/07/2011  Marjo Bicker Name:    Clinical Social Worker:  Truman Hayward, LCSW   Date/Time:  10/08/2011 12:00 N  Date Referred:  10/08/2011   Referral source  RN     Referred reason  [facility]   Other referral source:    I:  FAMILY / HOME ENVIRONMENT Child's legal guardian:  PARENT  Guardian - Name Guardian - Age Guardian - Address  [facility] [address] [place], [place] [address]  Loree Fee     Other household support members/support persons Other support:    II  PSYCHOSOCIAL DATA Information Source:  Patient Interview  Event organiser Employment:   Financial resources:   If Medicaid - County:    School / Grade:   Maternity Care Coordinator / Child Services Coordination / Early Interventions:  Cultural issues impacting care:   speaks little english    III  STRENGTHS Strengths  Home prepared for Child (including basic supplies)  Supportive family/friends  Adequate Resources   Strength comment:    IV  RISK FACTORS AND CURRENT PROBLEMS Current Problem:  None   Risk Factor & Current Problem Patient Issue Family Issue Risk Factor / Current Problem Comment   N N     V  SOCIAL WORK ASSESSMENT CSW spoke with MOB and FOB along with interpreter. Discussed LPNC with MOB.  MOB expressed no insurance and having issues financially with prenatal care.  Provided MOB and FOB with medicaid application in Spanish and instructed them on process to submit for medicaid.  Discussed family support and supplies and MOB does not voice any concerns at this time.  Also asked about emotional state and MOB does not report any concerns.  Please reconsult CSW if further needs arise.      VI SOCIAL WORK PLAN Social Work Plan  No Further Intervention Required / No Barriers to Discharge   Type of  pt/family education:   If child protective services report - county:   If child protective services report - date:   Information/referral to community resources comment:   Other social work plan:

## 2011-10-09 LAB — CULTURE, BETA STREP (GROUP B ONLY)

## 2011-10-09 NOTE — Progress Notes (Signed)
Post discharge chart review completed.  

## 2011-10-09 NOTE — H&P (Signed)
Agree with above note.  Lori Rojas 10/09/2011 10:16 AM

## 2011-10-13 NOTE — Discharge Summary (Signed)
Obstetric Discharge Summary Reason for Admission: onset of labor Prenatal Procedures: none Intrapartum Procedures: spontaneous vaginal delivery Postpartum Procedures: none Complications-Operative and Postpartum: none Hemoglobin  Date Value Range Status  10/07/2011 13.5  12.0 - 15.0 g/dL Final     HCT  Date Value Range Status  10/07/2011 39.7  36.0 - 46.0 % Final    Physical Exam:  General: alert, cooperative and no distress Lochia: appropriate Uterine Fundus: firm Incision: N/A DVT Evaluation: No evidence of DVT seen on physical exam. Negative Homan's sign. No cords or calf tenderness. No significant calf/ankle edema.  Discharge Diagnoses: Term Pregnancy-delivered  Discharge Information: Date: 10/13/2011 Activity: pelvic rest Diet: routine Medications: Ibuprofen Condition: stable Instructions: refer to practice specific booklet Discharge to: home Follow-up Information    Follow up with [facility]. (4-6 weeks)    Contact information:   [address] [place] [address] [phone]         Newborn Data: Live born female  Birth Weight: 7 lb (3175 g) APGAR: 9, 9  Home with mother.  LEFTWICH-KIRBY, Trever Streater 10/13/2011, 5:26 AM

## 2012-03-04 IMAGING — CR DG CHEST 2V
2 series · 2 of 2 positions shown · non-contrast
Comparison: None.

CLINICAL DATA: Fever.  Dizziness.  Nausea.

CHEST - 2 VIEW

[w chest pa]
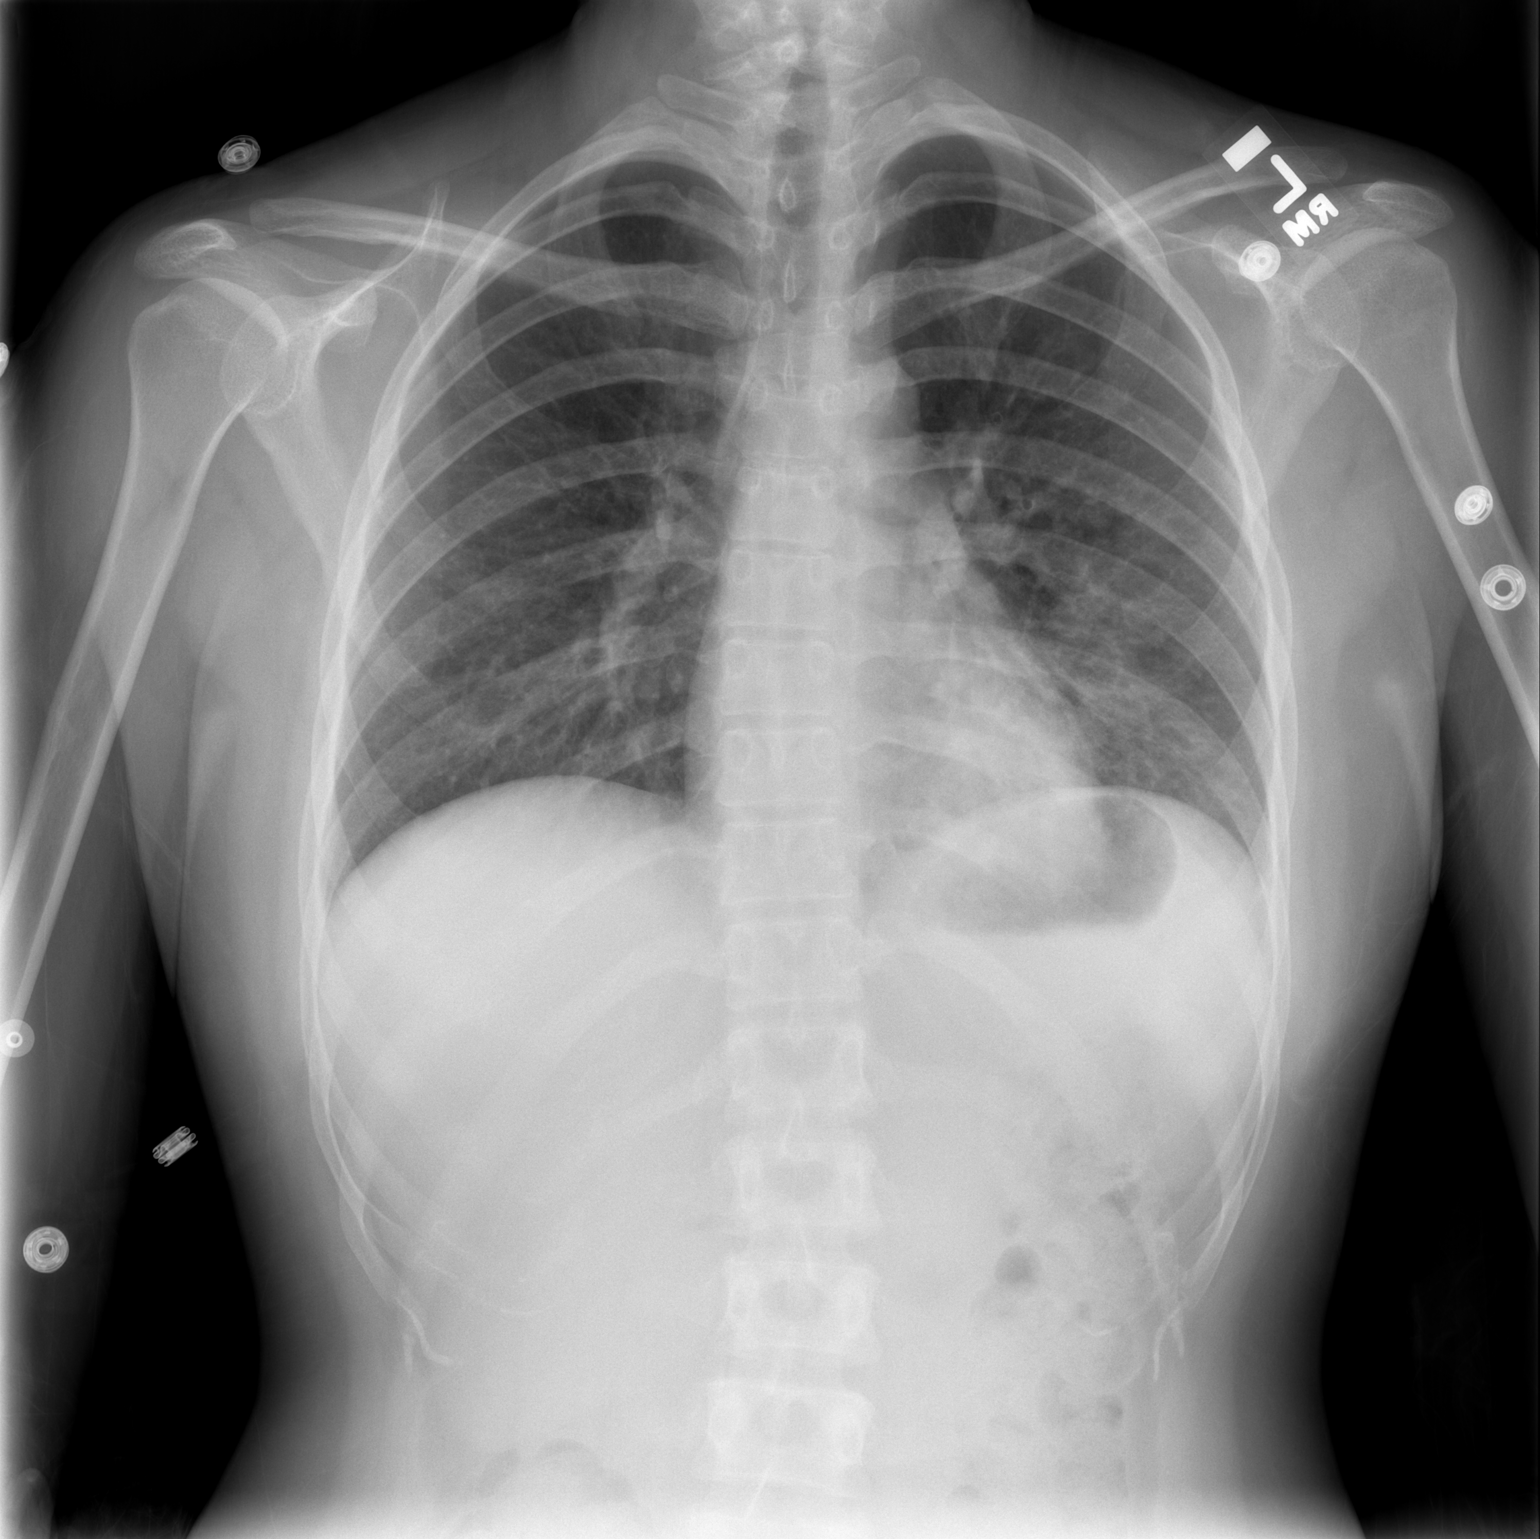

[w chest ap]
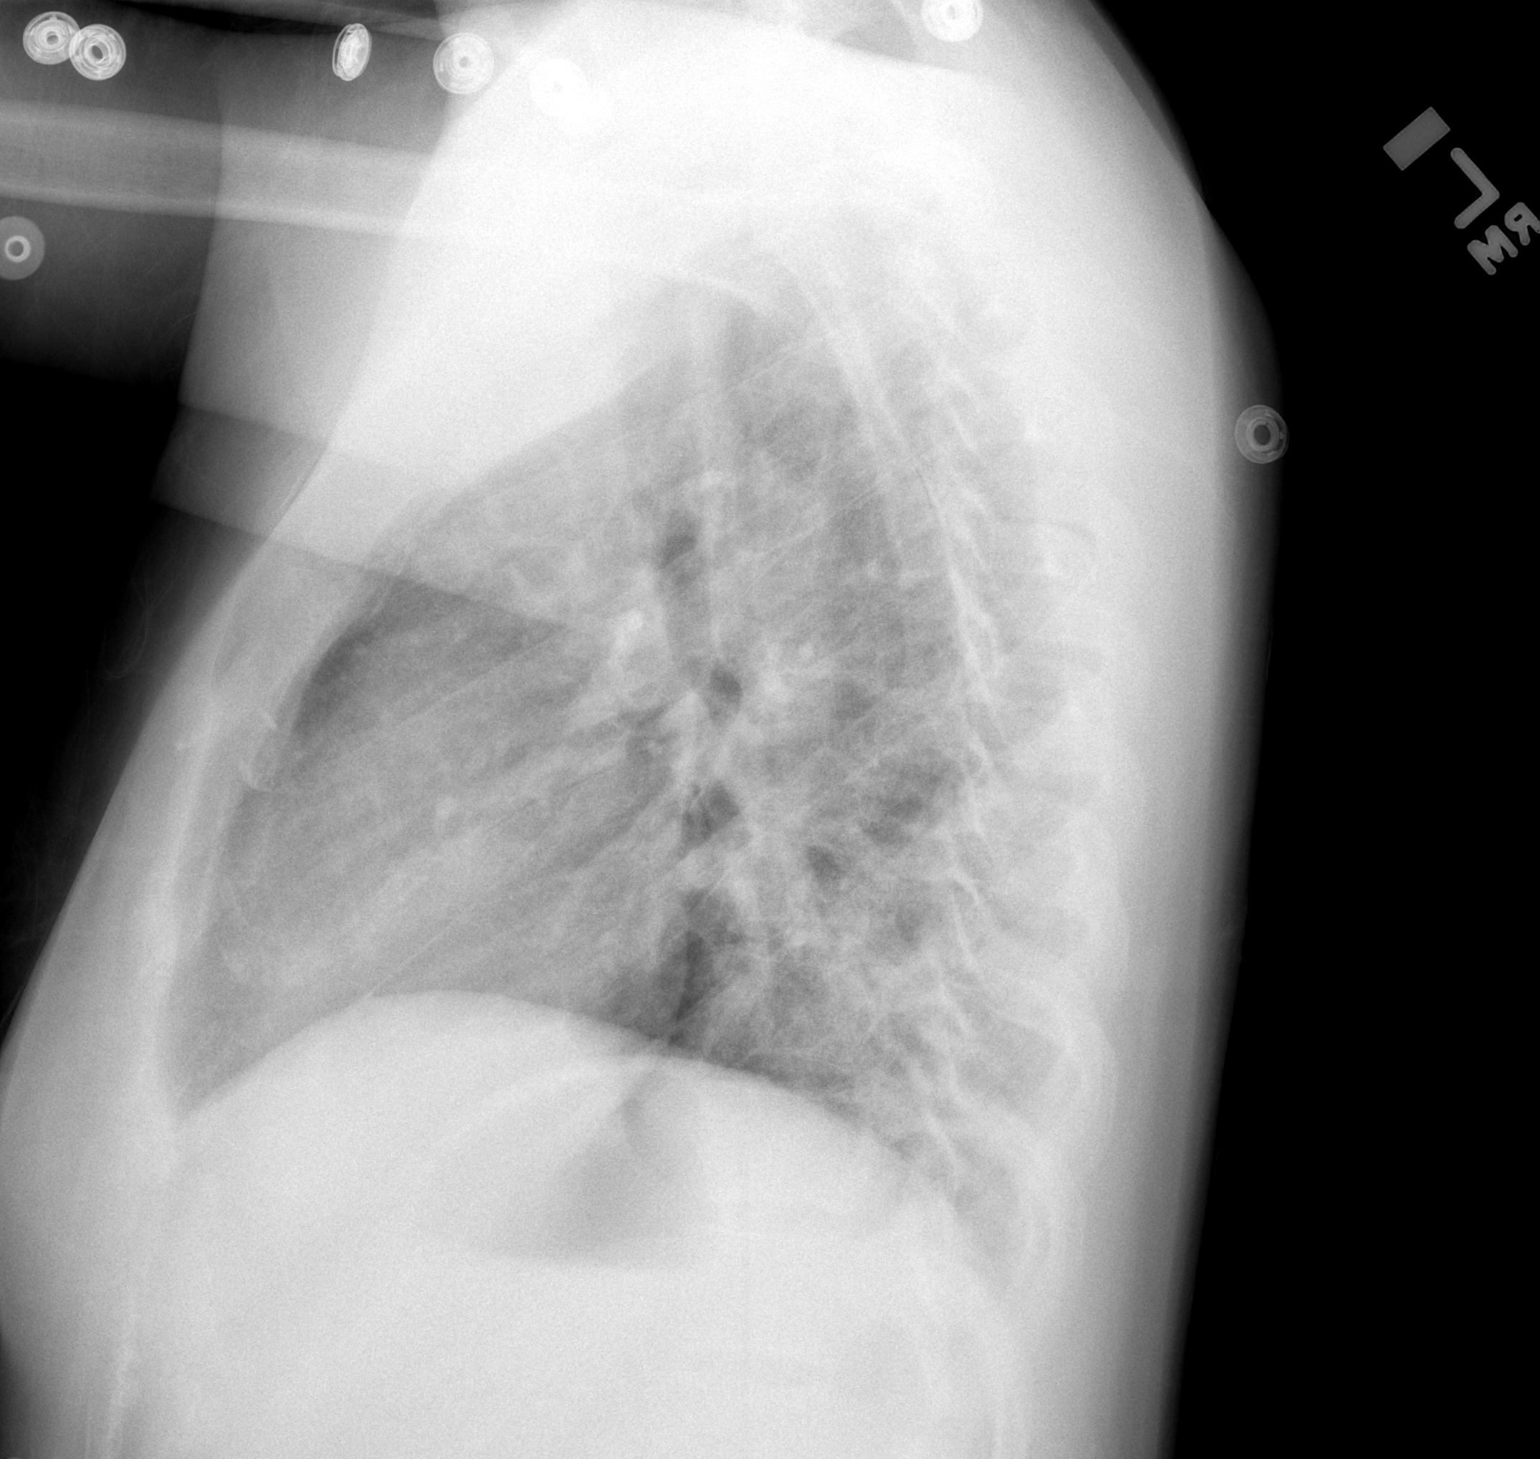

[2 of 2 positions shown; findings below may reference images not displayed]

FINDINGS: Left lower lobe pneumonia is present.  Patchy density is
present in the retrocardiac area and at the left lung base.  The
right lung base appears clear.  Trachea midline.  Cardiopericardial
silhouette normal.  No parapneumonic effusion.
IMPRESSION: Left lower lobe pneumonia.

## 2014-01-05 ENCOUNTER — Encounter (HOSPITAL_COMMUNITY): Payer: Self-pay | Admitting: *Deleted

## 2020-11-08 ENCOUNTER — Encounter (HOSPITAL_COMMUNITY): Payer: Self-pay | Admitting: Obstetrics and Gynecology

## 2020-11-08 ENCOUNTER — Other Ambulatory Visit: Payer: Self-pay

## 2020-11-08 ENCOUNTER — Other Ambulatory Visit: Payer: Self-pay | Admitting: Advanced Practice Midwife

## 2020-11-08 ENCOUNTER — Inpatient Hospital Stay (HOSPITAL_COMMUNITY)
Admission: AD | Admit: 2020-11-08 | Discharge: 2020-11-08 | Disposition: A | Discharge status: Home / Self Care | Payer: Self-pay | Attending: Obstetrics and Gynecology | Admitting: Obstetrics and Gynecology

## 2020-11-08 DIAGNOSIS — Z6790 Unspecified blood type, Rh positive: Secondary | ICD-10-CM | POA: Insufficient documentation

## 2020-11-08 DIAGNOSIS — O3680X0 Pregnancy with inconclusive fetal viability, not applicable or unspecified: Secondary | ICD-10-CM | POA: Insufficient documentation

## 2020-11-08 DIAGNOSIS — R748 Abnormal levels of other serum enzymes: Secondary | ICD-10-CM | POA: Insufficient documentation

## 2020-11-08 DIAGNOSIS — O26891 Other specified pregnancy related conditions, first trimester: Secondary | ICD-10-CM | POA: Insufficient documentation

## 2020-11-08 DIAGNOSIS — O26851 Spotting complicating pregnancy, first trimester: Secondary | ICD-10-CM | POA: Insufficient documentation

## 2020-11-08 DIAGNOSIS — Z3A12 12 weeks gestation of pregnancy: Secondary | ICD-10-CM | POA: Insufficient documentation

## 2020-11-08 DIAGNOSIS — Z79899 Other long term (current) drug therapy: Secondary | ICD-10-CM | POA: Insufficient documentation

## 2020-11-08 LAB — CBC
HCT: 41 % (ref 36.0–46.0)
Hemoglobin: 14.4 g/dL (ref 12.0–15.0)
MCH: 32.3 pg (ref 26.0–34.0)
MCHC: 35.1 g/dL (ref 30.0–36.0)
MCV: 91.9 fL (ref 80.0–100.0)
Platelets: 307 10*3/uL (ref 150–400)
RBC: 4.46 MIL/uL (ref 3.87–5.11)
RDW: 11.6 % (ref 11.5–15.5)
WBC: 11 10*3/uL — ABNORMAL HIGH (ref 4.0–10.5)
nRBC: 0 % (ref 0.0–0.2)

## 2020-11-08 LAB — COMPREHENSIVE METABOLIC PANEL
ALT: 66 U/L — ABNORMAL HIGH (ref 0–44)
AST: 45 U/L — ABNORMAL HIGH (ref 15–41)
Albumin: 3.8 g/dL (ref 3.5–5.0)
Alkaline Phosphatase: 82 U/L (ref 38–126)
Anion gap: 7 (ref 5–15)
BUN: 16 mg/dL (ref 6–20)
CO2: 26 mmol/L (ref 22–32)
Calcium: 9.5 mg/dL (ref 8.9–10.3)
Chloride: 104 mmol/L (ref 98–111)
Creatinine, Ser: 0.54 mg/dL (ref 0.44–1.00)
GFR, Estimated: >60 mL/min (ref >60)
Glucose, Bld: 104 mg/dL — ABNORMAL HIGH (ref 70–99)
Potassium: 3.9 mmol/L (ref 3.5–5.1)
Sodium: 137 mmol/L (ref 135–145)
Total Bilirubin: 0.4 mg/dL (ref 0.3–1.2)
Total Protein: 7.1 g/dL (ref 6.5–8.1)

## 2020-11-08 LAB — ABO/RH: ABO/RH(D): B POS

## 2020-11-08 LAB — HCG, QUANTITATIVE, PREGNANCY: hCG, Beta Chain, Quant, S: 93 m[IU]/mL — ABNORMAL HIGH (ref <5)

## 2020-11-08 LAB — POCT PREGNANCY, URINE: Preg Test, Ur: NEGATIVE

## 2020-11-08 NOTE — MAU Note (Signed)
Has been bleeding for 3 weeks. Went to [place] ED, had an ultrasound but nothing was seen besides a sac and could not say where the bleeding is coming from. Has appointment scheduled for [facility].  The bleeding is only when she wipes. Denies cramps currently.  LMP: 08/16/2020 Pain score: 0/10

## 2020-11-08 NOTE — MAU Provider Note (Addendum)
Patient Lori Rojas is a 35 y.o. G4P4004  At approximately [redacted] weeks gestation here for concern for miscarriage. Patient was seen in [facility] 2 weeks ago for vaginal bleeding. She had an [place] and reports that they "saw a sac" but no baby. She was told to follow-up in two weeks. During those two weeks her bleeding improved.  She had a positive pregnancy test three days ago. She came to [facility] today because she had to wait a long time in [facility].  Today she has a little bit of light pink spotting when she wipes; she denies pain.   Patient will have bHCG drawn and blood type, CBC, CMP Patient will wait for results.   History     CSN: [id number]  Arrival date and time: 11/08/20 1851   None     Chief Complaint  Patient presents with   Vaginal Bleeding   HPI  OB History     Gravida  5   Para  4   Term  4   Preterm      AB      Living  4      SAB      IAB      Ectopic      Multiple      Live Births  4           Past Medical History:  Diagnosis Date   No pertinent past medical history     Past Surgical History:  Procedure Laterality Date   NO PAST SURGERIES      Family History  Problem Relation Age of Onset   Anesthesia problems Neg Hx    Hypotension Neg Hx    Malignant hyperthermia Neg Hx    Pseudochol deficiency Neg Hx    Other Neg Hx     Social History   Tobacco Use   Smoking status: Never   Smokeless tobacco: Never  Substance Use Topics   Alcohol use: No   Drug use: No    Allergies: No Known Allergies  Medications Prior to Admission  Medication Sig Dispense Refill Last Dose   Prenatal Vit-Fe Fumarate-FA (PRENATAL MULTIVITAMIN) TABS Take 1 tablet by mouth daily.       Review of Systems  Constitutional:  Negative for chills, fatigue and fever.  Respiratory:  Negative for shortness of breath.   Cardiovascular:  Negative for chest pain.  Gastrointestinal:  Negative for abdominal pain, nausea and  vomiting.  Genitourinary:  Positive for frequency and vaginal bleeding. Negative for difficulty urinating, dysuria, flank pain, pelvic pain, vaginal discharge and vaginal pain.  Neurological:  Negative for dizziness and headaches.  Psychiatric/Behavioral: Negative.    Physical Exam   Blood pressure 100/77, pulse 86, temperature 98.1 F (36.7 C), temperature source Oral, resp. rate 18, height 5\' 2"  (1.575 m), weight 88.1 kg, SpO2 100 %, unknown if currently breastfeeding.  Physical Exam Vitals and nursing note reviewed.  Constitutional:      Appearance: She is well-developed.  Cardiovascular:     Rate and Rhythm: Normal rate.  Pulmonary:     Effort: Pulmonary effort is normal.  Abdominal:     Palpations: Abdomen is soft.  Musculoskeletal:        General: Normal range of motion.     Cervical back: Normal range of motion.  Skin:    General: Skin is warm and dry.  Neurological:     Mental Status: She is alert and oriented to person, place,  and time.  Psychiatric:        Behavior: Behavior normal.        Thought Content: Thought content normal.        Judgment: Judgment normal.    MAU Course  Procedures  MDM   Assessment and Plan    Charlesetta Garibaldi [facility] 11/08/2020, 7:48 PM   Results for orders placed or performed during the hospital encounter of 11/08/20 (from the past 24 hour(s))  Pregnancy, urine POC     Status: None   Collection Time: 11/08/20  7:36 PM  Result Value Ref Range   Preg Test, Ur NEGATIVE NEGATIVE  hCG, quantitative, pregnancy     Status: Abnormal   Collection Time: 11/08/20  8:04 PM  Result Value Ref Range   hCG, Beta Chain, Quant, S 93 (H) <5 mIU/mL  ABO/Rh     Status: None   Collection Time: 11/08/20  8:05 PM  Result Value Ref Range   ABO/RH(D) B POS    No rh immune globuloin      NOT A RH IMMUNE GLOBULIN CANDIDATE, PT RH POSITIVE Performed at [facility], [address] [place], [place] [address]   Comprehensive metabolic panel      Status: Abnormal   Collection Time: 11/08/20  8:05 PM  Result Value Ref Range   Sodium 137 135 - 145 mmol/L   Potassium 3.9 3.5 - 5.1 mmol/L   Chloride 104 98 - 111 mmol/L   CO2 26 22 - 32 mmol/L   Glucose, Bld 104 (H) 70 - 99 mg/dL   BUN 16 6 - 20 mg/dL   Creatinine, Ser 6.37 0.44 - 1.00 mg/dL   Calcium 9.5 8.9 - 85.8 mg/dL   Total Protein 7.1 6.5 - 8.1 g/dL   Albumin 3.8 3.5 - 5.0 g/dL   AST 45 (H) 15 - 41 U/L   ALT 66 (H) 0 - 44 U/L   Alkaline Phosphatase 82 38 - 126 U/L   Total Bilirubin 0.4 0.3 - 1.2 mg/dL   GFR, Estimated >85 >02 mL/min   Anion gap 7 5 - 15  CBC     Status: Abnormal   Collection Time: 11/08/20  8:05 PM  Result Value Ref Range   WBC 11.0 (H) 4.0 - 10.5 K/uL   RBC 4.46 3.87 - 5.11 MIL/uL   Hemoglobin 14.4 12.0 - 15.0 g/dL   HCT 77.4 12.8 - 78.6 %   MCV 91.9 80.0 - 100.0 fL   MCH 32.3 26.0 - 34.0 pg   MCHC 35.1 30.0 - 36.0 g/dL   RDW 76.7 20.9 - 47.0 %   Platelets 307 150 - 400 K/uL   nRBC 0.0 0.0 - 0.2 %     MDM:  Quant hcg today is 93.   Findings today could represent a normal early pregnancy, spontaneous abortion or ectopic pregnancy which can be life-threatening.  With positive pregnancy test reported 2 weeks ago, and vaginal bleeding, this most likely a miscarriage.  Ectopic precautions were given to the patient with plan to follow up in 48 hours at [facility] for repeat quant hcg to evaluate pregnancy development.  Consult Dr Donavan Foil regarding mildly elevated liver enzymes.  F/U with repeat CMP and acute hepatitis panel with repeat hcg on Thursday.  A/P: 1. Pregnancy of unknown anatomic location   2. Spotting affecting pregnancy in first trimester   3. Elevated liver enzymes   4. Blood type, Rh positive      D/C home with bleeding  precautions Spanish interpreter used for all communication between pt and  Sharen Counter, CNM.   Sharen Counter, CNM 12:16 AM

## 2020-11-11 ENCOUNTER — Other Ambulatory Visit: Payer: Self-pay

## 2020-11-11 ENCOUNTER — Ambulatory Visit (INDEPENDENT_AMBULATORY_CARE_PROVIDER_SITE_OTHER): Payer: Self-pay

## 2020-11-11 VITALS — BP 104/84 | HR 79

## 2020-11-11 DIAGNOSIS — O3680X0 Pregnancy with inconclusive fetal viability, not applicable or unspecified: Secondary | ICD-10-CM

## 2020-11-11 LAB — BETA HCG QUANT (REF LAB): hCG Quant: 72 m[IU]/mL

## 2020-11-11 NOTE — Progress Notes (Signed)
Pt here today for STAT beta s/p pregnancy of unknown location.  Pt reports with interpreter that she is here to get pregnancy level to see if they go up.  Pt denies vaginal bleeding and pain. Pt explained that she will receive a call around noon time with results and f/u. Pt verbalized understanding with no further questions.   Reviewed results with Dr. Vergie Living who recommends that pt comes back for a non stat beta in 7-10 days.  Pick[place], MD confirms that  pt has had a miscarriage.  Called pt with Spanish Interpreter Eda R., and informed pt results and that she has miscarried.  Pt scheduled for non stat beta on 11/22/20 @ 1000. Pt advised that if she starts to have increased pain to go to MAU.  Pt verbalized understanding.    Addison Naegeli, RN  11/11/20

## 2020-11-19 ENCOUNTER — Other Ambulatory Visit: Payer: Self-pay

## 2020-11-19 DIAGNOSIS — O3680X0 Pregnancy with inconclusive fetal viability, not applicable or unspecified: Secondary | ICD-10-CM

## 2020-11-22 ENCOUNTER — Other Ambulatory Visit: Payer: Self-pay

## 2020-11-24 ENCOUNTER — Other Ambulatory Visit: Payer: Self-pay

## 2020-11-24 DIAGNOSIS — O3680X0 Pregnancy with inconclusive fetal viability, not applicable or unspecified: Secondary | ICD-10-CM

## 2020-11-25 ENCOUNTER — Telehealth: Payer: Self-pay

## 2020-11-25 LAB — BETA HCG QUANT (REF LAB): hCG Quant: 55 m[IU]/mL

## 2020-11-25 NOTE — Telephone Encounter (Signed)
Call placed to pt with Eda interpreter. Pt given results and recommendations per Dr Vergie Living. Pt verbalized understanding. Pt scheduled for 1 week non-stat beta on 9/29 at 10am. Pt agreeable to date and time of appt.  Judeth Cornfield, RN

## 2020-11-25 NOTE — Telephone Encounter (Signed)
Patient called in requesting her ab results    Please call and advise

## 2020-11-26 NOTE — Progress Notes (Signed)
Patient was assessed and managed by nursing staff during this encounter. I have reviewed the chart and agree with the documentation and plan. I have also made any necessary editorial changes.  Roe Wilner, MD 11/26/2020 10:21 AM   

## 2020-12-02 ENCOUNTER — Other Ambulatory Visit: Payer: Self-pay

## 2020-12-02 DIAGNOSIS — O3680X0 Pregnancy with inconclusive fetal viability, not applicable or unspecified: Secondary | ICD-10-CM

## 2021-07-13 ENCOUNTER — Inpatient Hospital Stay (HOSPITAL_COMMUNITY): Payer: Self-pay

## 2021-07-13 ENCOUNTER — Inpatient Hospital Stay (HOSPITAL_COMMUNITY)
Admission: AD | Admit: 2021-07-13 | Discharge: 2021-07-13 | Disposition: A | Discharge status: Home / Self Care | Payer: Self-pay | Attending: Obstetrics and Gynecology | Admitting: Obstetrics and Gynecology

## 2021-07-13 ENCOUNTER — Encounter (HOSPITAL_COMMUNITY): Payer: Self-pay | Admitting: Obstetrics and Gynecology

## 2021-07-13 DIAGNOSIS — R1030 Lower abdominal pain, unspecified: Secondary | ICD-10-CM | POA: Insufficient documentation

## 2021-07-13 DIAGNOSIS — O26891 Other specified pregnancy related conditions, first trimester: Secondary | ICD-10-CM | POA: Insufficient documentation

## 2021-07-13 DIAGNOSIS — N898 Other specified noninflammatory disorders of vagina: Secondary | ICD-10-CM

## 2021-07-13 DIAGNOSIS — Z3A01 Less than 8 weeks gestation of pregnancy: Secondary | ICD-10-CM | POA: Insufficient documentation

## 2021-07-13 HISTORY — DX: Other specified health status: Z78.9

## 2021-07-13 LAB — URINALYSIS, ROUTINE W REFLEX MICROSCOPIC
Bilirubin Urine: NEGATIVE
Glucose, UA: NEGATIVE mg/dL
Hgb urine dipstick: NEGATIVE
Ketones, ur: NEGATIVE mg/dL
Nitrite: NEGATIVE
Protein, ur: NEGATIVE mg/dL
Specific Gravity, Urine: 1.014 (ref 1.005–1.030)
pH: 5 (ref 5.0–8.0)

## 2021-07-13 LAB — CBC
HCT: 40.3 % (ref 36.0–46.0)
Hemoglobin: 14 g/dL (ref 12.0–15.0)
MCH: 32 pg (ref 26.0–34.0)
MCHC: 34.7 g/dL (ref 30.0–36.0)
MCV: 92.2 fL (ref 80.0–100.0)
Platelets: 275 10*3/uL (ref 150–400)
RBC: 4.37 MIL/uL (ref 3.87–5.11)
RDW: 11.8 % (ref 11.5–15.5)
WBC: 7.4 10*3/uL (ref 4.0–10.5)
nRBC: 0 % (ref 0.0–0.2)

## 2021-07-13 LAB — HCG, QUANTITATIVE, PREGNANCY: hCG, Beta Chain, Quant, S: 36378 m[IU]/mL — ABNORMAL HIGH (ref <5)

## 2021-07-13 LAB — WET PREP, GENITAL
Clue Cells Wet Prep HPF POC: NONE SEEN
Sperm: NONE SEEN
Trich, Wet Prep: NONE SEEN
WBC, Wet Prep HPF POC: >=10 — AB (ref <10)
Yeast Wet Prep HPF POC: NONE SEEN

## 2021-07-13 NOTE — MAU Provider Note (Signed)
?History  ?  ? ?CSN: [id number] ? ?Arrival date and time: 07/13/21 1141 ? ? None  ?  ? ?Chief Complaint  ?Patient presents with  ? Abdominal Pain  ? Vaginal Discharge  ? ?HPI ?Lori Rojas is a 36 y.o. [id number] at [redacted]w[redacted]d by LMP who presents to MAU for abdominal cramping and vaginal discharge. Patient reports lower abdominal cramping started on Saturday, worse on left side. Pain is intermittent. She reports this morning she had 3 episodes of watery diarrhea and since then, pain seems to be getting better. She reports she noticed a green, mucous appearing vaginal discharge that started on the same day. She denies odor, but reports some intermittent itching.No vaginal bleeding, urinary s/s, fever, vomiting, or known sick contacts. LMP was 05/22/21. Patient had pregnancy confirmation at [facility] but no ultrasound. She reports she does not have OB appointment scheduled yet, but is suppose to hear back from HD about getting appointment scheduled. ? ? ?OB History   ? ? Gravida  ?6  ? Para  ?4  ? Term  ?4  ? Preterm  ?   ? AB  ?   ? Living  ?4  ?  ? ? SAB  ?   ? IAB  ?   ? Ectopic  ?   ? Multiple  ?   ? Live Births  ?4  ?   ?  ?  ? ? ?Past Medical History:  ?Diagnosis Date  ? Medical history non-contributory   ? No pertinent past medical history   ? ? ?Past Surgical History:  ?Procedure Laterality Date  ? NO PAST SURGERIES    ? ? ?Family History  ?Problem Relation Age of Onset  ? Anesthesia problems Neg Hx   ? Hypotension Neg Hx   ? Malignant hyperthermia Neg Hx   ? Pseudochol deficiency Neg Hx   ? Other Neg Hx   ? ? ?Social History  ? ?Tobacco Use  ? Smoking status: Never  ? Smokeless tobacco: Never  ?Substance Use Topics  ? Alcohol use: No  ? Drug use: No  ? ? ?Allergies: No Known Allergies ? ?No medications prior to admission.  ? ? ?Review of Systems  ?Constitutional: Negative.   ?Respiratory: Negative.    ?Cardiovascular: Negative.   ?Gastrointestinal:  Positive for abdominal pain and diarrhea. Negative for constipation,  nausea and vomiting.  ?Genitourinary:  Positive for vaginal discharge. Negative for dysuria, frequency and vaginal bleeding.  ?Musculoskeletal: Negative.   ?Physical Exam  ? ?Blood pressure (!) 104/59, pulse 78, temperature 99.4 ?F (37.4 ?C), temperature source Oral, resp. rate 14, height 5' 1.5" (1.562 m), weight 89.4 kg, last menstrual period 05/22/2021, SpO2 100 %, unknown if currently breastfeeding. ? ?Physical Exam ?Vitals and nursing note reviewed.  ?Constitutional:   ?   General: She is not in acute distress. ?Cardiovascular:  ?   Rate and Rhythm: Normal rate.  ?Pulmonary:  ?   Effort: Pulmonary effort is normal.  ?Abdominal:  ?   Palpations: Abdomen is soft.  ?   Tenderness: There is no abdominal tenderness.  ?Genitourinary: ?   Comments: Patient self-swabbed ?Skin: ?   General: Skin is warm and dry.  ?Neurological:  ?   General: No focal deficit present.  ?   Mental Status: She is alert and oriented to person, place, and time.  ?Psychiatric:     ?   Mood and Affect: Mood normal.     ?   Behavior: Behavior normal.  ? ?US  OB LESS THAN 14 WEEKS WITH OB TRANSVAGINAL ? ?Result Date: 07/13/2021 ?CLINICAL DATA:  Abdominal pain.  Pregnancy. EXAM: OBSTETRIC <14 WK [place] AND TRANSVAGINAL OB US TECHNIQUE: Both transabdominal and transvaginal ultrasound examinations were performed for complete evaluation of the gestation as well as the maternal uterus, adnexal regions, and pelvic cul-de-sac. Transvaginal technique was performed to assess early pregnancy. COMPARISON:  None Available. FINDINGS: Intrauterine gestational sac: Present, single. Yolk sac:  Present Embryo:  Present Cardiac Activity: Present Heart Rate: 127 bpm CRL:  5.7 mm   6 w    2 d                  [place] EDC: 03/06/2022 Subchorionic hemorrhage:  None visualized. Maternal uterus/adnexae: Normal appearing ovaries.  No free fluid. IMPRESSION: Normal appearing living early intrauterine pregnancy at 6 weeks 2 days by crown-rump length. Electronically Signed   By: Paulina Fusi M.D.   On: 07/13/2021 14:23   ? ?MAU Course  ?Procedures ? ?MDM ?UA, culture pending ?CBC, HCG ?Blood type B pos ?Wet prep negative, GC/CT pending ?US shows IUP with FHR present.  ?Given that cramping mostly improved after episodes of diarrhea, cramping could be GI related.  ?Spanish interpretor used for today's visit ? ?Assessment and Plan  ?[redacted] weeks gestation of pregnancy ?IUP ?Vaginal discharge ?Abdominal pain ? ? ?- Discharge home in stable condition ?- Return to MAU sooner for worsening symptoms ?- Call [facility] to schedule prenatal care ? ? ? ?Brand Males, CNM ?07/13/2021, 3:46 PM  ?

## 2021-07-13 NOTE — MAU Note (Signed)
Lori Rojas is a 36 y.o. at Unknown here in MAU reporting: early this morning, started having abd pain.  Went to the bathroom,  saw green d/c, is concerned about infection because she is pregnant. Went to the health dept in [facility] 4/25, was told they would call with appt.  Has confirmation letter with her.  ?Had watery diarrhea x3 this morning. No one at home is sick ?LMP: 3/19 ?Onset of complaint: this morning. ?Pain score: was severe- before diarrhea, mild now ?Vitals:  ? 07/13/21 1158  ?BP: 104/68  ?Pulse: 76  ?Resp: 17  ?Temp: 99.4 ?F (37.4 ?C)  ?SpO2: 100%  ?   ?Lab orders placed from triage:  UA ?

## 2021-07-14 LAB — CULTURE, OB URINE

## 2021-07-14 LAB — GC/CHLAMYDIA PROBE AMP (~~LOC~~) NOT AT ARMC
Chlamydia: NEGATIVE
Comment: NEGATIVE
Comment: NORMAL
Neisseria Gonorrhea: NEGATIVE

## 2021-12-13 ENCOUNTER — Inpatient Hospital Stay (HOSPITAL_COMMUNITY): Payer: Self-pay

## 2021-12-13 ENCOUNTER — Inpatient Hospital Stay (HOSPITAL_COMMUNITY)
Admission: AD | Admit: 2021-12-13 | Discharge: 2021-12-13 | Disposition: A | Discharge status: Home / Self Care | Payer: Self-pay | Attending: Family Medicine | Admitting: Family Medicine

## 2021-12-13 ENCOUNTER — Encounter (HOSPITAL_COMMUNITY): Payer: Self-pay | Admitting: Family Medicine

## 2021-12-13 DIAGNOSIS — Z3689 Encounter for other specified antenatal screening: Secondary | ICD-10-CM

## 2021-12-13 DIAGNOSIS — O09523 Supervision of elderly multigravida, third trimester: Secondary | ICD-10-CM | POA: Insufficient documentation

## 2021-12-13 DIAGNOSIS — K8020 Calculus of gallbladder without cholecystitis without obstruction: Secondary | ICD-10-CM | POA: Insufficient documentation

## 2021-12-13 DIAGNOSIS — O26893 Other specified pregnancy related conditions, third trimester: Secondary | ICD-10-CM | POA: Insufficient documentation

## 2021-12-13 DIAGNOSIS — O26613 Liver and biliary tract disorders in pregnancy, third trimester: Secondary | ICD-10-CM | POA: Insufficient documentation

## 2021-12-13 DIAGNOSIS — Z3A28 28 weeks gestation of pregnancy: Secondary | ICD-10-CM | POA: Insufficient documentation

## 2021-12-13 LAB — CBC
HCT: 34 % — ABNORMAL LOW (ref 36.0–46.0)
Hemoglobin: 11.7 g/dL — ABNORMAL LOW (ref 12.0–15.0)
MCH: 31.2 pg (ref 26.0–34.0)
MCHC: 34.4 g/dL (ref 30.0–36.0)
MCV: 90.7 fL (ref 80.0–100.0)
Platelets: 220 10*3/uL (ref 150–400)
RBC: 3.75 MIL/uL — ABNORMAL LOW (ref 3.87–5.11)
RDW: 12.6 % (ref 11.5–15.5)
WBC: 8.1 10*3/uL (ref 4.0–10.5)
nRBC: 0 % (ref 0.0–0.2)

## 2021-12-13 LAB — COMPREHENSIVE METABOLIC PANEL
ALT: 19 U/L (ref 0–44)
AST: 21 U/L (ref 15–41)
Albumin: 2.7 g/dL — ABNORMAL LOW (ref 3.5–5.0)
Alkaline Phosphatase: 64 U/L (ref 38–126)
Anion gap: 8 (ref 5–15)
BUN: 7 mg/dL (ref 6–20)
CO2: 20 mmol/L — ABNORMAL LOW (ref 22–32)
Calcium: 8.8 mg/dL — ABNORMAL LOW (ref 8.9–10.3)
Chloride: 105 mmol/L (ref 98–111)
Creatinine, Ser: 0.47 mg/dL (ref 0.44–1.00)
GFR, Estimated: >60 mL/min (ref >60)
Glucose, Bld: 126 mg/dL — ABNORMAL HIGH (ref 70–99)
Potassium: 3 mmol/L — ABNORMAL LOW (ref 3.5–5.1)
Sodium: 133 mmol/L — ABNORMAL LOW (ref 135–145)
Total Bilirubin: 0.3 mg/dL (ref 0.3–1.2)
Total Protein: 6.4 g/dL — ABNORMAL LOW (ref 6.5–8.1)

## 2021-12-13 NOTE — MAU Note (Signed)
Pt says at 0100- right side and back hurt- 8  And some pain right side of abd . [facility] [place]  Has an appointment today .

## 2021-12-13 NOTE — MAU Provider Note (Signed)
Chief Complaint:  Back Pain   Event Date/Time   First Provider Initiated Contact with Patient 12/13/21 0548     HPI: Lori Rojas is a 36 y.o. [id number] at [redacted]w[redacted]d who presents to maternity admissions reporting sudden onset severe upper right quadrant abdominal pain that radiated into her back and made her throw up. Woke her up out of sleep around 0100. Pain has since decreased to 1-2/10 and no additional nausea. No other physical complaints. Denies vaginal bleeding, leaking of fluid, decreased fetal movement, fever, falls, or recent illness.   Pregnancy Course: Receives prenatal care at the [facility], no records available to review.   Past Medical History:  Diagnosis Date   Medical history non-contributory    No pertinent past medical history    OB History  Gravida Para Term Preterm AB Living  6 4 4     4   SAB IAB Ectopic Multiple Live Births          4    # Outcome Date GA Lbr Len/2nd Weight Sex Delivery Anes PTL Lv  6 Current           5 Term 10/07/11 [redacted]w[redacted]d / 00:05 7 lb (3.175 kg) M Vag-Spont None  LIV  4 Term 2010    M Vag-Spont   LIV  3 Term 2007    M Vag-Spont   LIV  2 Term 2006    F Vag-Spont   LIV  1 Gravida            Past Surgical History:  Procedure Laterality Date   NO PAST SURGERIES     Family History  Problem Relation Age of Onset   Anesthesia problems Neg Hx    Hypotension Neg Hx    Malignant hyperthermia Neg Hx    Pseudochol deficiency Neg Hx    Other Neg Hx    Social History   Tobacco Use   Smoking status: Never   Smokeless tobacco: Never  Vaping Use   Vaping Use: Never used  Substance Use Topics   Alcohol use: No   Drug use: No   No Known Allergies No medications prior to admission.   I have reviewed patient's Past Medical Hx, Surgical Hx, Family Hx, Social Hx, medications and allergies.   ROS:  Pertinent items noted in HPI and remainder of comprehensive ROS otherwise negative.   Physical Exam  Patient Vitals for the  past 24 hrs:  BP Temp Temp src Pulse Resp SpO2 Height Weight  12/13/21 0704 113/66 97.9 F (36.6 C) Oral 95 17 99 % -- --  12/13/21 0530 104/69 -- -- 92 18 96 % -- --  12/13/21 0451 104/71 -- -- 96 18 -- -- --  12/13/21 0421 99/67 97.6 F (36.4 C) Oral 94 (!) 22 -- 5' (1.524 m) 204 lb 11.2 oz (92.9 kg)   Constitutional: Well-developed, well-nourished female in no acute distress.  Cardiovascular: normal rate & rhythm, warm and well-perfused Respiratory: normal effort, no problems with respiration noted GI: Abd soft, non-tender, gravid appropriate for gestational age MS: Extremities nontender, no edema, normal ROM Neurologic: Alert and oriented x 4.  GU: no CVA tenderness Pelvic: exam deferred  Fetal Tracing: reactive Baseline: 135 Variability: moderate Accelerations: 15x15 Decelerations: none Toco: none   Labs: Results for orders placed or performed during the hospital encounter of 12/13/21 (from the past 24 hour(s))  CBC     Status: Abnormal   Collection Time: 12/13/21  5:25 AM  Result Value Ref  Range   WBC 8.1 4.0 - 10.5 K/uL   RBC 3.75 (L) 3.87 - 5.11 MIL/uL   Hemoglobin 11.7 (L) 12.0 - 15.0 g/dL   HCT 98.9 (L) 21.1 - 94.1 %   MCV 90.7 80.0 - 100.0 fL   MCH 31.2 26.0 - 34.0 pg   MCHC 34.4 30.0 - 36.0 g/dL   RDW 74.0 81.4 - 48.1 %   Platelets 220 150 - 400 K/uL   nRBC 0.0 0.0 - 0.2 %  Comprehensive metabolic panel     Status: Abnormal   Collection Time: 12/13/21  5:25 AM  Result Value Ref Range   Sodium 133 (L) 135 - 145 mmol/L   Potassium 3.0 (L) 3.5 - 5.1 mmol/L   Chloride 105 98 - 111 mmol/L   CO2 20 (L) 22 - 32 mmol/L   Glucose, Bld 126 (H) 70 - 99 mg/dL   BUN 7 6 - 20 mg/dL   Creatinine, Ser 8.56 0.44 - 1.00 mg/dL   Calcium 8.8 (L) 8.9 - 10.3 mg/dL   Total Protein 6.4 (L) 6.5 - 8.1 g/dL   Albumin 2.7 (L) 3.5 - 5.0 g/dL   AST 21 15 - 41 U/L   ALT 19 0 - 44 U/L   Alkaline Phosphatase 64 38 - 126 U/L   Total Bilirubin 0.3 0.3 - 1.2 mg/dL   GFR, Estimated  >31 >49 mL/min   Anion gap 8 5 - 15   Imaging:  US Abdomen Limited RUQ (LIVER/GB)  Result Date: 12/13/2021 CLINICAL DATA:  Right upper quadrant pain. EXAM: ULTRASOUND ABDOMEN LIMITED RIGHT UPPER QUADRANT COMPARISON:  None Available. FINDINGS: Gallbladder: 1.8 cm calcified/shadowing gallstone identified. No gallbladder wall thickening or pericholecystic fluid. The sonographer reports a positive sonographic Murphy sign. Common bile duct: Diameter: 4 mm Liver: No focal mass lesion evident. Increased parenchymal echogenicity suggests fatty deposition. Portal vein is patent on color Doppler imaging with normal direction of blood flow towards the liver. Other: None. IMPRESSION: 1. Cholelithiasis without gallbladder wall thickening or pericholecystic fluid. The sonographer reports a positive sonographic Murphy sign. Close clinical correlation for signs/symptoms of acute cholecystitis recommended. 2. No biliary dilatation. 3. Probable hepatic steatosis. Electronically Signed   By: Kennith Center M.D.   On: 12/13/2021 06:24    MAU Course: Orders Placed This Encounter  Procedures   US Abdomen Limited RUQ (LIVER/GB)   CBC   Comprehensive metabolic panel   Ambulatory referral to General Surgery   Discharge patient   No orders of the defined types were placed in this encounter.  MDM: No complaints of pain or nausea on admission to MAU. Lab work and ultrasound ordered.  Labs normal but ultrasound noted a large 1.8cm stone in the gallbladder. Discussed presentation/findings with Dr. Shawnie Pons who agreed that since pt is not acutely ill she is stable for discharge home with education and general surgery referral.   Pt declined interpreter for triage and exam. When explaining cholelithiasis, Spanish interpreter present via [employer] video. Pt educated strongly on importance of following a gallbladder diet. Surgical referral placed.  Assessment: 1. Calculus of gallbladder without cholecystitis without obstruction    2. [redacted] weeks gestation of pregnancy   3. NST (non-stress test) reactive    Plan: Discharge home in stable condition with return precautions.     Follow-up Information     [facility] Follow up.   Specialty: Pediatrics Why: as scheduled for ongoing prenatal care Contact information: [address] [place] [address]  [phone]                 Allergies as of 12/13/2021   No Known Allergies      Medication List     TAKE these medications    prenatal multivitamin Tabs tablet Take 1 tablet by mouth daily.       Edd Arbour, CNM, MSN, IBCLC Certified Nurse Midwife, [facility]

## 2022-03-15 HISTORY — PX: DILATION AND CURETTAGE OF UTERUS: SHX78

## 2022-03-29 ENCOUNTER — Ambulatory Visit: Payer: Self-pay | Admitting: Surgery

## 2022-03-29 NOTE — H&P (View-Only) (Signed)
   Lori Rojas [id number]   Referring Provider:  Gabriel Carina, CNM   Subjective   Chief Complaint: New Consultation (gallbladder)     History of Present Illness:    37 year old woman with no known medical problems or previous abdominal surgery who is referred for evaluation of gallstones.  This was diagnosed when she was [redacted] weeks pregnant in October.  She had severe right upper quadrant pain radiating to her back and associated with emesis. Lab work at that time notable for sodium of 133, potassium of 3, CO2 of 20, LFTs normal, hemoglobin 11.7, WBC 8.1. Ultrasound done 12/13/2021 notes a 1.8 cm gallstone without sonographic evidence of cholecystitis but a positive reported fever sign,  Common bile duct 4 mm, evidence of hepatic steatosis.  I have reviewed these images and results personally. She is now status post SVD on 12/28, subsequent D&C for retained products of conception done 2 weeks ago. She reports least 2 further episodes of biliary colic since the initial diagnosis, each lasting 5 to 6 hours.    Review of Systems: A complete review of systems was obtained from the patient.  I have reviewed this information and discussed as appropriate with the patient.  See HPI as well for other ROS.   Medical History: History reviewed. No pertinent past medical history.  There is no problem list on file for this patient.   History reviewed. No pertinent surgical history.   No Known Allergies  No current outpatient medications on file prior to visit.   No current facility-administered medications on file prior to visit.    Family History  Problem Relation Age of Onset   High blood pressure (Hypertension) Mother      Social History   Tobacco Use  Smoking Status Never  Smokeless Tobacco Never     Social History   Socioeconomic History   Marital status: Unknown  Tobacco Use   Smoking status: Never   Smokeless tobacco: Never  Substance and Sexual Activity    Alcohol use: Not Currently   Drug use: Never    Objective:    Vitals:   03/29/22 1425  BP: 100/66  Pulse: 74  Temp: 36.7 C (98 F)  SpO2: 98%  Weight: 79.8 kg (176 lb)  Height: 152.4 cm (5')    Body mass index is 34.37 kg/m.  Gen: A&Ox3, no distress  Unlabored respirations Abd soft, nontender, nondistended  Assessment and Plan:  Diagnoses and all orders for this visit:  Biliary colic    I recommend proceeding with laparoscopic or robotic cholecystectomy with possible cholangiogram.  Discussed the relevant anatomy using a diagram to demonstrate, and went over surgical technique.  Discussed risks of surgery including bleeding, pain, scarring, intraabdominal injury specifically to the common bile duct and sequelae, bile leak, conversion to open surgery, failure to resolve symptoms, blood clots/ pulmonary embolus, heart attack, pneumonia, stroke, death. Questions welcomed and answered to patient's satisfaction.  Patient would like to be scheduled as soon as possible.  Annelisa Ryback Raquel James, MD

## 2022-03-29 NOTE — H&P (Signed)
   Lori Rojas [id number]   Referring Provider:  Walker, Jamilla R, CNM   Subjective   Chief Complaint: New Consultation (gallbladder)     History of Present Illness:    36-year-old woman with no known medical problems or previous abdominal surgery who is referred for evaluation of gallstones.  This was diagnosed when she was [redacted] weeks pregnant in October.  She had severe right upper quadrant pain radiating to her back and associated with emesis. Lab work at that time notable for sodium of 133, potassium of 3, CO2 of 20, LFTs normal, hemoglobin 11.7, WBC 8.1. Ultrasound done 12/13/2021 notes a 1.8 cm gallstone without sonographic evidence of cholecystitis but a positive reported fever sign,  Common bile duct 4 mm, evidence of hepatic steatosis.  I have reviewed these images and results personally. She is now status post SVD on 12/28, subsequent D&C for retained products of conception done 2 weeks ago. She reports least 2 further episodes of biliary colic since the initial diagnosis, each lasting 5 to 6 hours.    Review of Systems: A complete review of systems was obtained from the patient.  I have reviewed this information and discussed as appropriate with the patient.  See HPI as well for other ROS.   Medical History: History reviewed. No pertinent past medical history.  There is no problem list on file for this patient.   History reviewed. No pertinent surgical history.   No Known Allergies  No current outpatient medications on file prior to visit.   No current facility-administered medications on file prior to visit.    Family History  Problem Relation Age of Onset   High blood pressure (Hypertension) Mother      Social History   Tobacco Use  Smoking Status Never  Smokeless Tobacco Never     Social History   Socioeconomic History   Marital status: Unknown  Tobacco Use   Smoking status: Never   Smokeless tobacco: Never  Substance and Sexual Activity    Alcohol use: Not Currently   Drug use: Never    Objective:    Vitals:   03/29/22 1425  BP: 100/66  Pulse: 74  Temp: 36.7 C (98 F)  SpO2: 98%  Weight: 79.8 kg (176 lb)  Height: 152.4 cm (5')    Body mass index is 34.37 kg/m.  Gen: A&Ox3, no distress  Unlabored respirations Abd soft, nontender, nondistended  Assessment and Plan:  Diagnoses and all orders for this visit:  Biliary colic    I recommend proceeding with laparoscopic or robotic cholecystectomy with possible cholangiogram.  Discussed the relevant anatomy using a diagram to demonstrate, and went over surgical technique.  Discussed risks of surgery including bleeding, pain, scarring, intraabdominal injury specifically to the common bile duct and sequelae, bile leak, conversion to open surgery, failure to resolve symptoms, blood clots/ pulmonary embolus, heart attack, pneumonia, stroke, death. Questions welcomed and answered to patient's satisfaction.  Patient would like to be scheduled as soon as possible.  Lori Haddaway AMANDA Rangel Echeverri, MD  

## 2022-04-02 NOTE — Patient Instructions (Signed)
DEBIDO AL COVID-19 SLO SE PERMITEN DOS VISITANTES (de 16 aos en adelante)  PARA QUE VENGAN CON USTED Y SE QUEDEN EN LA SALA DE ESPERA SOLAMENTE DURANTE EL PRE OP Y EL PROCEDIMIENTO.   **NO SE PERMITEN VISITAS EN EL REA DE CORTA ESTADA NI EN LA SALA DE RECUPERACIN!!**  SI VA A SER INGRESADO(A) AL HOSPITAL SLO SE LE PERMITEN CUATRO PERSONAS DE APOYO DURANTE LAS HORAS DE VISITA (7 AM -8PM)   La(s) persona(s) de apoyo debe(n) pasar nuestra evaluacin, entrar y salir con gel y Teaching laboratory technician en todo momento, incluso en la habitacin del [place]. Los pacientes tambin deben usar una mscara cuando el personal o su persona de apoyo estn en la habitacin. Los visitantes DEBEN LLEVAR ETIQUETA DE VISITANTE DE UNA MANERA VISIBLE. Un visitante adulto International aid/development worker con usted durante la noche y DEBE estar en la habitacin a las 8 P.M.     Su procedimiento est programado en:  04-07-2022   Presntese a la entrada [place]     Presntese a admisiones por la maana:  10:15  am   Llame a este nmero si tiene [employer] maana de la ciruga al [phone]   No consuma alimentos: Despus de la medianoche   SIGA LA PREPARACIN INTESTINAL Y CUALQUIER INSTRUCCIN ADICIONAL PREOPERATORIA QUE HAYA RECIBIDO DEL OFICINA DE DU CIRUJANO!!!     La higiene bucal tambin es importante para reducir el riesgo de infeccin.                                   Recuerde - LVESE LOS DIENTES EN LA MAANA DE LA CIRUGA CON SU PASTA DENTAL HABITUAL   NO fume despus de la medianoche   [employer] medicamentos en la maana de la ciruga con UN SORBO DE AGUA:  none                              No debe trae ningn metal en el cuerpo, incluyendo pinzas para el cabello, joyas, ni aretes/pendientes             No use maquillaje, lociones/cremas, polvos, perfumes o desodorante  No use esmalte de uas, incluyendo los de gel ni S&S, uas artificiales/acrlicas o cualquier otro tipo de  cobertura en las uas naturales, [place]. Si tiene uas artificiales, con capas de gel, etc. que necesite que le quiten en un saln de uas, por favor, pida que se lo quiten antes de la ciruga o la ciruga podra ser cancelada/retrasada si el cirujano o el anestesilogo consideran que no puede ser monitoreado(a) de una forma segura.   No se rasure en las 48 horas antes de la operacin.      No traiga objetos de valor al hospital. [place] NO SE HACE RESPONSABLE DE LOS OBJETOS DE VALOR.   Los contactos, las dentaduras o los puentes no se pueden usar durante la [place].   Sheela Stack una bolsa pequea para la noche el da de la [place].   NO TRAIGA AL HOSPITAL LOS MEDICAMENTOS QUE TOMA EN CASA . [place] EN SU LISTA [place]!    Los pacientes dados de alta el mismo da de la ciruga no podrn Air cabin crew a casa.  Es [employer]  alguien se quede con [employer] primeras 24 horas despus de la anestesia.   Instrucciones especiales: Ane [employer] copia de sus documentos de poder notarial y testamento vital el da de su ciruga si no los ha escaneado antes.              Por favor, lea las siguientes hojas informativas que le dieron: SI TIENE PREGUNTAS SOBRE SUS INSTRUCCIONES PREOPERATORIAS POR FAVOR LLAME AL [phone]                           PREPARACIN PARA LA CIRUGA                                           Debido a que la piel no est esterilizada, sta necesita estar lo ms libre de grmenes como sea posible.  Usted puede reducir el nmero de grmenes en la piel lavndose con el jabn de CHG (Chlorahexidine gluconate) antes de la ciruga.  El CHG es un jabn antisptico el cual mata los grmenes y se une a la piel para continuar matando los grmenes incluso hasta despus de lavarse. POR FAVOR NO LO USE SI USTED TIENE ALERGIAS AL CHG.  SI LA PIEL SE IRRITA, DEJE DE  USAR EL CHG.  NO SE RASURE DURANTE AL MENOS 12 HORAS ANTES DE LA PRIMERA DUCHA CON EL CHG. Siga estas instrucciones cuidadosamente:  Dchese la noche anterior a la [place] y de nuevo en la maana de la [place]. Si decide lavarse el cabello, lvelo con su champ normal primero. Enjuague el cabello y el cuerpo para quitarse el [place]. Use el CHG como lo hara con cualquier otro jabn lquido, usando una toallita o esponja vegetal o exfoliante. Aplique el CHG al cuerpo solamente DEL CUELLO PARA ABAJO.  No lo use cerca de los ojos o los genitales. No se lave con su jabn normal despus de usar el CHG. Squese con una toalla limpia. Espere hasta la maana siguiente para aplicarse desodorantes, lociones, excepto en el da de la [place], NO SE APLIQUE LOCIONES. Use pijamas limpias o una bata. Coloque sbanas limpias en su cama la noche de su primera ducha - no duerma con mascotas. 10.  Use ropa limpia al venir al hospital.    Patient's Signature:_______________________________  Nurse Signature _________________________________  Interpreter signature: ______________________________

## 2022-04-02 NOTE — Progress Notes (Addendum)
Patient was given surgery instructions and consent for surgery that were translated into Spanish. Patient voiced understanding of all instructions per her Spanish Interpreter, Rebeca Allegra (with Language resources).   COVID Vaccine received:  []  No [x]  Yes Date of any COVID positive Test in last 90 days:  None  PCP - none Cardiologist - none  Chest x-ray -  EKG - no history to warrant  Stress Test -  ECHO -  Cardiac Cath -   Patient was seen at [facility] on 04-02-22 and had CMP, CBC diff. She had no blood work drawn at her PST appt  PCR screen: []  Ordered & Completed                      []   No Order but Needs PROFEND                      [x]   N/A for this surgery  Surgery Plan:  [x]  Ambulatory                            []  Outpatient in bed                            []  Admit  Anesthesia:    [x]  General  []  Spinal                           []   Choice []   MAC  Pacemaker / ICD device [x]  No []  Yes        Device order form faxed [x]  No    []   Yes      Faxed to:  Spinal Cord Stimulator:[x]  No []  Yes      (Remind patient to bring remote DOS) Other Implants:   History of Sleep Apnea? [x]  No []  Yes   CPAP used?- [x]  No []  Yes    Does the patient monitor blood sugar? []  No []  Yes  [x]  N/A  Blood Thinner / Instructions: none Aspirin Instructions: none  ERAS Protocol Ordered: [x]  No  []  Yes  Patient is to be NPO after: Midnight prior  Comments: Patient had SVD- delivered on 03-02-2022. Cholelithiasis diagnosed at 28 weeks. Had D&C for retained products of conception on 03-15-2022.  Activity level: Patient can climb a flight of stairs without difficulty; [x]  No CP  [x]  No SOB. Patient can perform ADLs without assistance.   Anesthesia review: no pertinent history, takes no medications.   Patient denies shortness of breath, fever, cough and chest pain at PAT appointment.

## 2022-04-04 ENCOUNTER — Encounter (HOSPITAL_COMMUNITY)
Admission: RE | Admit: 2022-04-04 | Discharge: 2022-04-04 | Disposition: A | Discharge status: Home / Self Care | Payer: Self-pay | Source: Ambulatory Visit | Attending: Surgery | Admitting: Surgery

## 2022-04-04 ENCOUNTER — Other Ambulatory Visit: Payer: Self-pay

## 2022-04-04 ENCOUNTER — Encounter (HOSPITAL_COMMUNITY): Payer: Self-pay | Admitting: *Deleted

## 2022-04-04 DIAGNOSIS — Z01812 Encounter for preprocedural laboratory examination: Secondary | ICD-10-CM | POA: Insufficient documentation

## 2022-04-07 ENCOUNTER — Ambulatory Visit (HOSPITAL_COMMUNITY)
Admission: RE | Admit: 2022-04-07 | Discharge: 2022-04-07 | Disposition: A | Discharge status: Home / Self Care | Payer: Self-pay | Source: Ambulatory Visit | Attending: Surgery | Admitting: Surgery

## 2022-04-07 ENCOUNTER — Encounter (HOSPITAL_COMMUNITY)
Admission: RE | Disposition: A | Discharge status: Home / Self Care | Payer: Self-pay | Source: Ambulatory Visit | Attending: Surgery

## 2022-04-07 ENCOUNTER — Ambulatory Visit (HOSPITAL_BASED_OUTPATIENT_CLINIC_OR_DEPARTMENT_OTHER): Payer: Self-pay | Admitting: Anesthesiology

## 2022-04-07 ENCOUNTER — Encounter (HOSPITAL_COMMUNITY): Payer: Self-pay | Admitting: Surgery

## 2022-04-07 ENCOUNTER — Other Ambulatory Visit: Payer: Self-pay

## 2022-04-07 ENCOUNTER — Ambulatory Visit (HOSPITAL_COMMUNITY): Payer: Self-pay | Admitting: Anesthesiology

## 2022-04-07 DIAGNOSIS — K760 Fatty (change of) liver, not elsewhere classified: Secondary | ICD-10-CM | POA: Insufficient documentation

## 2022-04-07 DIAGNOSIS — K8010 Calculus of gallbladder with chronic cholecystitis without obstruction: Secondary | ICD-10-CM | POA: Insufficient documentation

## 2022-04-07 DIAGNOSIS — K8044 Calculus of bile duct with chronic cholecystitis without obstruction: Secondary | ICD-10-CM

## 2022-04-07 HISTORY — PX: CHOLECYSTECTOMY: SHX55

## 2022-04-07 LAB — POCT PREGNANCY, URINE: Preg Test, Ur: NEGATIVE

## 2022-04-07 SURGERY — LAPAROSCOPIC CHOLECYSTECTOMY WITH INTRAOPERATIVE CHOLANGIOGRAM
Anesthesia: General

## 2022-04-07 MED ORDER — OXYCODONE HCL 5 MG PO TABS
ORAL_TABLET | ORAL | Status: AC
  Administered 2022-04-07: 5 mg via ORAL
  Filled 2022-04-07: qty 1

## 2022-04-07 MED ORDER — BUPIVACAINE LIPOSOME 1.3 % IJ SUSP: 20.0000 mL | Freq: Once | INTRAMUSCULAR | Status: DC

## 2022-04-07 MED ORDER — GABAPENTIN 300 MG PO CAPS
300.0000 mg | ORAL_CAPSULE | ORAL | Status: AC
  Administered 2022-04-07: 300 mg via ORAL
  Filled 2022-04-07: qty 1

## 2022-04-07 MED ORDER — FENTANYL CITRATE (PF) 100 MCG/2ML IJ SOLN
INTRAMUSCULAR | Status: AC
  Filled 2022-04-07: qty 2

## 2022-04-07 MED ORDER — DEXAMETHASONE SODIUM PHOSPHATE 10 MG/ML IJ SOLN
INTRAMUSCULAR | Status: AC
  Filled 2022-04-07: qty 1

## 2022-04-07 MED ORDER — OXYCODONE HCL 5 MG PO TABS
ORAL_TABLET | ORAL | Status: AC
  Filled 2022-04-07: qty 1

## 2022-04-07 MED ORDER — ROCURONIUM BROMIDE 100 MG/10ML IV SOLN
INTRAVENOUS | Status: DC | PRN
  Administered 2022-04-07: 60 mg via INTRAVENOUS

## 2022-04-07 MED ORDER — MIDAZOLAM HCL 2 MG/2ML IJ SOLN
INTRAMUSCULAR | Status: AC
  Filled 2022-04-07: qty 2

## 2022-04-07 MED ORDER — ROCURONIUM BROMIDE 10 MG/ML (PF) SYRINGE
PREFILLED_SYRINGE | INTRAVENOUS | Status: AC
  Filled 2022-04-07: qty 10

## 2022-04-07 MED ORDER — TRAMADOL HCL 50 MG PO TABS: 50.0000 mg | ORAL_TABLET | Freq: Four times a day (QID) | ORAL | 0 refills | Status: AC | PRN

## 2022-04-07 MED ORDER — LIDOCAINE HCL (PF) 2 % IJ SOLN
INTRAMUSCULAR | Status: DC | PRN
  Administered 2022-04-07: 60 mg via INTRADERMAL

## 2022-04-07 MED ORDER — MIDAZOLAM HCL 5 MG/5ML IJ SOLN
INTRAMUSCULAR | Status: DC | PRN
  Administered 2022-04-07: 2 mg via INTRAVENOUS

## 2022-04-07 MED ORDER — KETOROLAC TROMETHAMINE 30 MG/ML IJ SOLN
INTRAMUSCULAR | Status: AC
  Administered 2022-04-07: 30 mg
  Filled 2022-04-07: qty 1

## 2022-04-07 MED ORDER — SODIUM CHLORIDE 0.9% FLUSH: 3.0000 mL | INTRAVENOUS | Status: DC | PRN

## 2022-04-07 MED ORDER — SUGAMMADEX SODIUM 200 MG/2ML IV SOLN
INTRAVENOUS | Status: DC | PRN
  Administered 2022-04-07: 200 mg via INTRAVENOUS

## 2022-04-07 MED ORDER — CHLORHEXIDINE GLUCONATE 4 % EX LIQD: 60.0000 mL | Freq: Once | CUTANEOUS | Status: DC

## 2022-04-07 MED ORDER — KETOROLAC TROMETHAMINE 30 MG/ML IJ SOLN: 30.0000 mg | Freq: Once | INTRAMUSCULAR | Status: AC

## 2022-04-07 MED ORDER — ONDANSETRON HCL 4 MG/2ML IJ SOLN
INTRAMUSCULAR | Status: DC | PRN
  Administered 2022-04-07: 4 mg via INTRAVENOUS

## 2022-04-07 MED ORDER — FENTANYL CITRATE PF 50 MCG/ML IJ SOSY: 25.0000 ug | PREFILLED_SYRINGE | INTRAMUSCULAR | Status: DC | PRN

## 2022-04-07 MED ORDER — DEXAMETHASONE SODIUM PHOSPHATE 10 MG/ML IJ SOLN
INTRAMUSCULAR | Status: DC | PRN
  Administered 2022-04-07: 10 mg via INTRAVENOUS

## 2022-04-07 MED ORDER — FENTANYL CITRATE PF 50 MCG/ML IJ SOSY
25.0000 ug | PREFILLED_SYRINGE | INTRAMUSCULAR | Status: DC | PRN
  Administered 2022-04-07: 25 ug via INTRAVENOUS

## 2022-04-07 MED ORDER — ACETAMINOPHEN 500 MG PO TABS
1000.0000 mg | ORAL_TABLET | ORAL | Status: AC
  Administered 2022-04-07: 1000 mg via ORAL
  Filled 2022-04-07: qty 2

## 2022-04-07 MED ORDER — BUPIVACAINE HCL (PF) 0.25 % IJ SOLN
INTRAMUSCULAR | Status: AC
  Filled 2022-04-07: qty 30

## 2022-04-07 MED ORDER — DOCUSATE SODIUM 100 MG PO CAPS: 100.0000 mg | ORAL_CAPSULE | Freq: Two times a day (BID) | ORAL | 0 refills | Status: AC

## 2022-04-07 MED ORDER — PROPOFOL 10 MG/ML IV BOLUS
INTRAVENOUS | Status: AC
  Filled 2022-04-07: qty 20

## 2022-04-07 MED ORDER — FENTANYL CITRATE (PF) 100 MCG/2ML IJ SOLN
INTRAMUSCULAR | Status: DC | PRN
  Administered 2022-04-07: 100 ug via INTRAVENOUS

## 2022-04-07 MED ORDER — BUPIVACAINE HCL (PF) 0.25 % IJ SOLN
INTRAMUSCULAR | Status: DC | PRN
  Administered 2022-04-07: 30 mL

## 2022-04-07 MED ORDER — ORAL CARE MOUTH RINSE: 15.0000 mL | Freq: Once | OROMUCOSAL | Status: AC

## 2022-04-07 MED ORDER — BUPIVACAINE LIPOSOME 1.3 % IJ SUSP
INTRAMUSCULAR | Status: DC | PRN
  Administered 2022-04-07: 20 mL

## 2022-04-07 MED ORDER — BUPIVACAINE LIPOSOME 1.3 % IJ SUSP
INTRAMUSCULAR | Status: AC
  Filled 2022-04-07: qty 20

## 2022-04-07 MED ORDER — OXYCODONE HCL 5 MG PO TABS
5.0000 mg | ORAL_TABLET | ORAL | Status: DC | PRN
  Administered 2022-04-07: 5 mg via ORAL

## 2022-04-07 MED ORDER — OXYCODONE HCL 5 MG PO TABS: 5.0000 mg | ORAL_TABLET | Freq: Once | ORAL | Status: AC

## 2022-04-07 MED ORDER — FENTANYL CITRATE PF 50 MCG/ML IJ SOSY
PREFILLED_SYRINGE | INTRAMUSCULAR | Status: AC
  Filled 2022-04-07: qty 2

## 2022-04-07 MED ORDER — ONDANSETRON HCL 4 MG/2ML IJ SOLN
INTRAMUSCULAR | Status: AC
  Filled 2022-04-07: qty 2

## 2022-04-07 MED ORDER — PROMETHAZINE HCL 25 MG/ML IJ SOLN: 6.2500 mg | INTRAMUSCULAR | Status: DC | PRN

## 2022-04-07 MED ORDER — INDOCYANINE GREEN 25 MG IV SOLR
7.5000 mg | Freq: Once | INTRAVENOUS | Status: AC
  Administered 2022-04-07: 7.5 mg via INTRAVENOUS
  Filled 2022-04-07: qty 3

## 2022-04-07 MED ORDER — PROPOFOL 10 MG/ML IV BOLUS
INTRAVENOUS | Status: DC | PRN
  Administered 2022-04-07: 160 mg via INTRAVENOUS

## 2022-04-07 MED ORDER — CHLORHEXIDINE GLUCONATE 0.12 % MT SOLN
15.0000 mL | Freq: Once | OROMUCOSAL | Status: AC
  Administered 2022-04-07: 15 mL via OROMUCOSAL

## 2022-04-07 MED ORDER — LACTATED RINGERS IV SOLN: INTRAVENOUS | Status: DC

## 2022-04-07 MED ORDER — ACETAMINOPHEN 650 MG RE SUPP: 650.0000 mg | RECTAL | Status: DC | PRN

## 2022-04-07 MED ORDER — SODIUM CHLORIDE 0.9 % IV SOLN: 250.0000 mL | INTRAVENOUS | Status: DC | PRN

## 2022-04-07 MED ORDER — SCOPOLAMINE 1 MG/3DAYS TD PT72
1.0000 | MEDICATED_PATCH | Freq: Once | TRANSDERMAL | Status: DC
  Administered 2022-04-07: 1.5 mg via TRANSDERMAL
  Filled 2022-04-07: qty 1

## 2022-04-07 MED ORDER — SODIUM CHLORIDE 0.9% FLUSH: 3.0000 mL | Freq: Two times a day (BID) | INTRAVENOUS | Status: DC

## 2022-04-07 MED ORDER — 0.9 % SODIUM CHLORIDE (POUR BTL) OPTIME
TOPICAL | Status: DC | PRN
  Administered 2022-04-07: 1000 mL

## 2022-04-07 MED ORDER — LACTATED RINGERS IR SOLN
Status: DC | PRN
  Administered 2022-04-07: 1000 mL

## 2022-04-07 MED ORDER — ACETAMINOPHEN 325 MG PO TABS: 650.0000 mg | ORAL_TABLET | ORAL | Status: DC | PRN

## 2022-04-07 MED ORDER — CEFAZOLIN SODIUM-DEXTROSE 2-4 GM/100ML-% IV SOLN
2.0000 g | INTRAVENOUS | Status: AC
  Administered 2022-04-07: 2 g via INTRAVENOUS
  Filled 2022-04-07: qty 100

## 2022-04-07 SURGICAL SUPPLY — 47 items
ADH SKN CLS APL DERMABOND .7 (GAUZE/BANDAGES/DRESSINGS)
APL PRP STRL LF DISP 70% ISPRP (MISCELLANEOUS) ×1
APL SKNCLS STERI-STRIP NONHPOA (GAUZE/BANDAGES/DRESSINGS)
APPLIER CLIP ROT 10 11.4 M/L (STAPLE) ×1
APR CLP MED LRG 11.4X10 (STAPLE) ×1
BAG COUNTER SPONGE SURGICOUNT (BAG) ×1 IMPLANT
BAG SPNG CNTER NS LX DISP (BAG) ×1
BENZOIN TINCTURE PRP APPL 2/3 (GAUZE/BANDAGES/DRESSINGS) IMPLANT
BNDG ADH 1X3 SHEER STRL LF (GAUZE/BANDAGES/DRESSINGS) IMPLANT
BNDG ADH THN 3X1 STRL LF (GAUZE/BANDAGES/DRESSINGS)
CABLE HIGH FREQUENCY MONO STRZ (ELECTRODE) ×1 IMPLANT
CHLORAPREP W/TINT 26 (MISCELLANEOUS) ×2 IMPLANT
CLIP APPLIE ROT 10 11.4 M/L (STAPLE) ×2 IMPLANT
COVER MAYO STAND XLG (MISCELLANEOUS) IMPLANT
COVER SURGICAL LIGHT HANDLE (MISCELLANEOUS) ×2 IMPLANT
DERMABOND ADVANCED .7 DNX12 (GAUZE/BANDAGES/DRESSINGS) IMPLANT
DRAPE C-ARM 42X120 X-RAY (DRAPES) IMPLANT
ELECT REM PT RETURN 15FT ADLT (MISCELLANEOUS) ×2 IMPLANT
GLOVE BIO SURGEON STRL SZ 6 (GLOVE) ×2 IMPLANT
GLOVE INDICATOR 6.5 STRL GRN (GLOVE) ×1 IMPLANT
GLOVE SS BIOGEL STRL SZ 6 (GLOVE) ×2 IMPLANT
GOWN STRL REUS W/ TWL LRG LVL3 (GOWN DISPOSABLE) ×1 IMPLANT
GOWN STRL REUS W/ TWL XL LVL3 (GOWN DISPOSABLE) IMPLANT
GOWN STRL REUS W/TWL LRG LVL3 (GOWN DISPOSABLE) ×1
GOWN STRL REUS W/TWL XL LVL3 (GOWN DISPOSABLE)
GRASPER SUT TROCAR 14GX15 (MISCELLANEOUS) IMPLANT
HEMOSTAT SNOW SURGICEL 2X4 (HEMOSTASIS) IMPLANT
IRRIG SUCT STRYKERFLOW 2 WTIP (MISCELLANEOUS) ×1
IRRIGATION SUCT STRKRFLW 2 WTP (MISCELLANEOUS) ×1 IMPLANT
KIT BASIN OR (CUSTOM PROCEDURE TRAY) ×1 IMPLANT
KIT TURNOVER KIT A (KITS) IMPLANT
NDL INSUFFLATION 14GA 120MM (NEEDLE) ×1 IMPLANT
NEEDLE INSUFFLATION 14GA 120MM (NEEDLE) ×1 IMPLANT
SCISSORS LAP 5X35 DISP (ENDOMECHANICALS) ×2 IMPLANT
SET CHOLANGIOGRAPH MIX (MISCELLANEOUS) IMPLANT
SET TUBE SMOKE EVAC HIGH FLOW (TUBING) ×1 IMPLANT
SLEEVE Z-THREAD 5X100MM (TROCAR) ×2 IMPLANT
SPIKE FLUID TRANSFER (MISCELLANEOUS) ×1 IMPLANT
STRIP CLOSURE SKIN 1/2X4 (GAUZE/BANDAGES/DRESSINGS) IMPLANT
SUT MNCRL AB 4-0 PS2 18 (SUTURE) ×1 IMPLANT
SYS BAG RETRIEVAL 10MM (BASKET) ×1
SYSTEM BAG RETRIEVAL 10MM (BASKET) ×2 IMPLANT
TOWEL OR 17X26 10 PK STRL BLUE (TOWEL DISPOSABLE) ×1 IMPLANT
TOWEL OR NON WOVEN STRL DISP B (DISPOSABLE) IMPLANT
TRAY LAPAROSCOPIC (CUSTOM PROCEDURE TRAY) ×2 IMPLANT
TROCAR ADV FIXATION 12X100MM (TROCAR) ×2 IMPLANT
TROCAR Z-THREAD OPTICAL 5X100M (TROCAR) ×1 IMPLANT

## 2022-04-07 NOTE — Progress Notes (Addendum)
Interpreter available at 1:30 post op Truitt Merle [phone]). Spouse also prefers update by [place] interpreter.

## 2022-04-07 NOTE — Interval H&P Note (Signed)
History and Physical Interval Note:  04/07/2022 10:41 AM  Lori Rojas  has presented today for surgery, with the diagnosis of BILIARY COLIC.  The various methods of treatment have been discussed with the patient and family. After consideration of risks, benefits and other options for treatment, the patient has consented to  Procedure(s): LAPAROSCOPIC CHOLECYSTECTOMY WITH ICG DYE (N/A) as a surgical intervention.  The patient's history has been reviewed, patient examined, no change in status, stable for surgery.  I have reviewed the patient's chart and labs.  Questions were answered to the patient's satisfaction.     Momo Braun Rich Brave

## 2022-04-07 NOTE — Discharge Instructions (Signed)
LAPAROSCOPIC SURGERY: POST OP INSTRUCTIONS   EAT Gradually transition to a high fiber diet with a fiber supplement over the next few weeks after discharge.  Start with a pureed / full liquid diet (see below)  WALK Walk an hour a day (cumulative- not all at once).  Control your pain to do that.    CONTROL PAIN Control pain so that you can walk, sleep, tolerate sneezing/coughing, go up/down stairs.  HAVE A BOWEL MOVEMENT DAILY Keep your bowels regular to avoid problems.  OK to try a laxative to override constipation.  OK to use an antidiarrheal to slow down diarrhea.  Call if not better after 2 tries  CALL IF YOU HAVE PROBLEMS/CONCERNS Call if you are still struggling despite following these instructions. Call if you have concerns not answered by these instructions    DIET: Follow a light bland diet & liquids the first 24 hours after arrival home, such as soup, liquids, starches, etc.  Be sure to drink plenty of fluids.  Quickly advance to a usual solid diet within a few days.  Avoid fast food or heavy meals initially as you are more likely to get nauseated or have irregular bowels.  A low-sugar, high-fiber diet for the rest of your life is ideal.  Take your usually prescribed home medications unless otherwise directed.  PAIN CONTROL: Pain is best controlled by a usual combination of three different methods TOGETHER: Ice/Heat Over the counter pain medication Prescription pain medication Most patients will experience some swelling and bruising around the incisions.  Ice packs or heating pads (30-60 minutes up to 6 times a day) will help. Use ice for the first few days to help decrease swelling and bruising, then switch to heat to help relax tight/sore spots and speed recovery.  Some people prefer to use ice alone, heat alone, alternating between ice & heat.  Experiment to what works for you.  Swelling and bruising can take several weeks to resolve.   It is helpful to take an  over-the-counter pain medication regularly for the first few days: Naproxen (Aleve, etc)  Two 220m tabs twice a day OR Ibuprofen (Advil, etc) Three 2090mtabs four times a day (every meal & bedtime) AND Acetaminophen (Tylenol, etc) 500-65074mour times a day (every meal & bedtime) A  prescription for pain medication (such as oxycodone, hydrocodone, tramadol, gabapentin, methocarbamol, etc) should be given to you upon discharge.  Take your pain medication as prescribed, IF NEEDED.  If you are having problems/concerns with the prescription medicine (does not control pain, nausea, vomiting, rash, itching, etc), please call us [phone] see if we need to switch you to a different pain medicine that will work better for you and/or control your side effect better. If you need a refill on your pain medication, please give us [place] hour notice.  contact your pharmacy.  They will contact our office to request authorization. Prescriptions will not be filled after 5 pm or on week-ends  Avoid getting constipated.   Between the surgery and the pain medications, it is common to experience some constipation.   Increasing fluid intake and taking a fiber supplement (such as Metamucil, Citrucel, FiberCon, MiraLax, etc) 1-2 times a day regularly will usually help prevent this problem from occurring.   A mild laxative (prune juice, Milk of Magnesia, MiraLax, etc) should be taken according to package directions if there are no bowel movements after 48 hours.   Watch out for diarrhea.   If you have many  loose bowel movements, simplify your diet to bland foods & liquids for a few days.   Stop any stool softeners and decrease your fiber supplement.   Switching to mild anti-diarrheal medications (Kayopectate, Pepto Bismol) can help.   If this worsens or does not improve, please call us.  Wash / shower every day.  You may shower over the skin glue which is waterproof.  Do not soak or submerge incisions.  No rubbing,  scrubbing, lotions or ointments to incisions.  Glue will flake off after about 2 weeks.  You may leave the incision open to air.  You may replace a dressing/Band-Aid to cover the incision for comfort if you wish.   ACTIVITIES as tolerated:   You may resume regular (light) daily activities beginning the next day--such as daily self-care, walking, climbing stairs--gradually increasing activities as tolerated.  If you can walk 30 minutes without difficulty, it is safe to try more intense activity such as jogging, treadmill, bicycling, low-impact aerobics, swimming, etc. Save the most intensive and strenuous activity for last such as sit-ups, heavy lifting, contact sports, etc  Refrain from any heavy lifting or straining until you are off narcotics for pain control.   DO NOT PUSH THROUGH PAIN.  Let pain be your guide: If it hurts to do something, don't do it.  Pain is your body warning you to avoid that activity for another week until the pain goes down. You may drive when you are no longer taking prescription pain medication, you can comfortably wear a seatbelt, and you can safely maneuver your car and apply brakes. You may have sexual intercourse when it is comfortable.  FOLLOW UP in our office Please call [facility] at [phone] to set up an appointment to see your surgeon in the office for a follow-up appointment approximately 2-3 weeks after your surgery. Make sure that you call for this appointment the day you arrive home to insure a convenient appointment time.  10. IF YOU HAVE DISABILITY OR FAMILY LEAVE FORMS, BRING THEM TO THE OFFICE FOR PROCESSING.  DO NOT GIVE THEM TO YOUR DOCTOR.   WHEN TO CALL us [phone]: Poor pain control Reactions / problems with new medications (rash/itching, nausea, etc)  Fever over 101.5 F (38.5 C) Inability to urinate Nausea and/or vomiting Worsening swelling or bruising Continued bleeding from incision. Increased pain, redness, or drainage from the  incision   The clinic staff is available to answer your questions during regular business hours (8:30am-5pm).  Please don't hesitate to call and ask to speak to one of our nurses for clinical concerns.   If you have a medical emergency, go to the nearest emergency room or call 911.  A surgeon from [facility] is always on call at the [facility], [place], [place], [place], [place]  [id number] ? MAIN: [phone] ? TOLL FREE: [phone] ?  FAX [phone] [email or web address]

## 2022-04-07 NOTE — Anesthesia Postprocedure Evaluation (Signed)
Anesthesia Post Note  Patient: Lori Rojas  Procedure(s) Performed: LAPAROSCOPIC CHOLECYSTECTOMY WITH ICG DYE     Patient location during evaluation: PACU Anesthesia Type: General Level of consciousness: awake and alert Pain management: pain level controlled Vital Signs Assessment: post-procedure vital signs reviewed and stable Respiratory status: spontaneous breathing, nonlabored ventilation, respiratory function stable and patient connected to nasal cannula oxygen Cardiovascular status: blood pressure returned to baseline and stable Postop Assessment: no apparent nausea or vomiting Anesthetic complications: no   No notable events documented.  Last Vitals:  Vitals:   04/07/22 1430 04/07/22 1445  BP: 111/85 109/87  Pulse: (!) 57 (!) 57  Resp: 13 15  Temp:    SpO2: 97% 98%    Last Pain:  Vitals:   04/07/22 1445  TempSrc:   PainSc: Waller Rhylen Shaheen

## 2022-04-07 NOTE — Transfer of Care (Signed)
Immediate Anesthesia Transfer of Care Note  Patient: Lori Rojas  Procedure(s) Performed: LAPAROSCOPIC CHOLECYSTECTOMY WITH ICG DYE  Patient Location: PACU  Anesthesia Type:General  Level of Consciousness: drowsy and patient cooperative  Airway & Oxygen Therapy: Patient Spontanous Breathing and Patient connected to face mask oxygen  Post-op Assessment: Report given to RN and Post -op Vital signs reviewed and stable  Post vital signs: Reviewed and stable  Last Vitals:  Vitals Value Taken Time  BP 128/80 04/07/22 1347  Temp    Pulse 67 04/07/22 1348  Resp 14 04/07/22 1348  SpO2 100 % 04/07/22 1348  Vitals shown include unvalidated device data.  Last Pain:  Vitals:   04/07/22 1046  TempSrc:   PainSc: 0-No pain      Patients Stated Pain Goal: 4 (24/40/10 2725)  Complications: No notable events documented.

## 2022-04-07 NOTE — Anesthesia Procedure Notes (Signed)
Procedure Name: Intubation Date/Time: 04/07/2022 12:39 PM  Performed by: Randye Lobo, CRNAPre-anesthesia Checklist: Patient identified, Emergency Drugs available, Suction available and Patient being monitored Patient Re-evaluated:Patient Re-evaluated prior to induction Oxygen Delivery Method: Circle System Utilized Preoxygenation: Pre-oxygenation with 100% oxygen Induction Type: IV induction Ventilation: Mask ventilation without difficulty Laryngoscope Size: Mac and Miller Grade View: Grade I Tube type: Oral Tube size: 7.0 mm Number of attempts: 1 Airway Equipment and Method: Stylet and Oral airway Placement Confirmation: ETT inserted through vocal cords under direct vision, positive ETCO2 and breath sounds checked- equal and bilateral Secured at: 22 cm Tube secured with: Tape Dental Injury: Teeth and Oropharynx as per pre-operative assessment

## 2022-04-07 NOTE — Op Note (Signed)
Operative Note  Lori Rojas 37 y.o. female [id number]  04/07/2022  Surgeon: Clovis Riley MD FACS  Procedure performed: Laparoscopic Cholecystectomy  Preop diagnosis: biliary colic Post-op diagnosis/intraop findings: Chronic cholecystitis, hepatic steatosis  Specimens: gallbladder  Retained items: none  EBL: minimal  Complications: none  Description of procedure: After obtaining informed consent the patient was brought to the operating room. Antibiotics were administered. SCD's were applied. General endotracheal anesthesia was initiated and a formal time-out was performed. The abdomen was prepped and draped in the usual sterile fashion and the abdomen was entered using an infraumbilical veress needle after instilling the site with local. Insufflation to 81mmHg was obtained, 19mm trocar and camera inserted, and gross inspection revealed no evidence of injury from our entry or other intraabdominal abnormalities. Two 78mm trocars were introduced in the right midclavicular and right anterior axillary lines under direct visualization and following infiltration with local. A 41mm trocar was placed in the epigastrium.  The liver was noted to be slightly enlarged with appearance changes consistent with hepatic steatosis.  The gallbladder was slightly intrahepatic and there were omental adhesions to the gallbladder body.  The omental adhesions were carefully taken down with cautery and blunt dissection.  The gallbladder fundus was retracted cephalad and the infundibulum was retracted laterally. A combination of hook electrocautery and blunt dissection was utilized to clear the peritoneum from the neck and cystic duct, circumferentially isolating the cystic artery and cystic duct and lifting the gallbladder from the cystic plate. The critical view of safety was achieved with the cystic artery, cystic duct, and liver bed visualized between them with no other structures. The artery was diminutive and  was divided with cautery.  The cystic duct was ligated with three clips on the proximal end and 1 on the specimen side and then divided sharply. The gallbladder was dissected from the liver plate using electrocautery. Once freed the gallbladder was placed in an endocatch bag and removed intact through the epigastric trocar site, which did have to be extended slightly to a large stone impacted within the gallbladder. A small amount of oozing on the liver bed was controlled with cautery. The right upper quadrant was irrigated and the effluent was clear. Hemostasis was once again confirmed, and reinspection of the abdomen revealed no injuries. The clips were well opposed without any bile leak from the duct or the liver bed. The 76mm trocar site in the epigastrium was closed with a 0 vicryl in the fascia under direct visualization using a PMI device. The abdomen was desufflated and all trocars removed. The skin incisions were closed with subcuticular 4-0 monocryl and Dermabond. The patient was awakened, extubated and transported to the recovery room in stable condition.    All counts were correct at the completion of the case.

## 2022-04-07 NOTE — Anesthesia Preprocedure Evaluation (Signed)
Anesthesia Evaluation  Patient identified by MRN, date of birth, ID band Patient awake    Reviewed: Allergy & Precautions, NPO status , Patient's Chart, lab work & pertinent test results  Airway Mallampati: II  TM Distance: >3 FB Neck ROM: Full    Dental  (+) Teeth Intact, Dental Advisory Given   Pulmonary neg pulmonary ROS   Pulmonary exam normal breath sounds clear to auscultation       Cardiovascular negative cardio ROS Normal cardiovascular exam Rhythm:Regular Rate:Normal     Neuro/Psych negative neurological ROS     GI/Hepatic negative GI ROS,,,BILIARY COLIC   Endo/Other  Obesity   Renal/GU negative Renal ROS     Musculoskeletal negative musculoskeletal ROS (+)    Abdominal   Peds  Hematology negative hematology ROS (+)   Anesthesia Other Findings Day of surgery medications reviewed with the patient.  Reproductive/Obstetrics                             Anesthesia Physical Anesthesia Plan  ASA: 2  Anesthesia Plan: General   Post-op Pain Management: Tylenol PO (pre-op)* and Gabapentin PO (pre-op)*   Induction: Intravenous  PONV Risk Score and Plan: 4 or greater and Midazolam, Dexamethasone, Ondansetron and Scopolamine patch - Pre-op  Airway Management Planned: Oral ETT  Additional Equipment:   Intra-op Plan:   Post-operative Plan: Extubation in OR  Informed Consent: I have reviewed the patients History and Physical, chart, labs and discussed the procedure including the risks, benefits and alternatives for the proposed anesthesia with the patient or authorized representative who has indicated his/her understanding and acceptance.     Dental advisory given  Plan Discussed with: CRNA  Anesthesia Plan Comments:        Anesthesia Quick Evaluation

## 2022-04-08 ENCOUNTER — Encounter (HOSPITAL_COMMUNITY): Payer: Self-pay | Admitting: Surgery

## 2022-04-18 LAB — SURGICAL PATHOLOGY
